# Patient Record
Sex: Female | Born: 1950 | Race: White | Hispanic: No | Marital: Single
Health system: Midwestern US, Academic
[De-identification: ages and names within clinical notes are randomized; demographics above are authoritative.]

---

## 2016-09-01 ENCOUNTER — Encounter: Admit: 2016-09-01 | Discharge: 2016-09-02

## 2016-09-01 DIAGNOSIS — R69 Illness, unspecified: Principal | ICD-10-CM

## 2016-10-03 ENCOUNTER — Encounter: Admit: 2016-10-03 | Discharge: 2016-10-04

## 2016-10-03 DIAGNOSIS — R69 Illness, unspecified: Principal | ICD-10-CM

## 2016-11-03 ENCOUNTER — Encounter: Admit: 2016-11-03 | Discharge: 2016-11-03

## 2016-11-03 DIAGNOSIS — R69 Illness, unspecified: Principal | ICD-10-CM

## 2016-11-11 ENCOUNTER — Encounter: Admit: 2016-11-11 | Discharge: 2016-11-11

## 2016-11-11 ENCOUNTER — Ambulatory Visit: Admit: 2016-11-11 | Discharge: 2016-11-11 | Payer: MEDICARE

## 2016-11-11 DIAGNOSIS — I639 Cerebral infarction, unspecified: Principal | ICD-10-CM

## 2016-11-11 DIAGNOSIS — M76822 Posterior tibial tendinitis, left leg: Principal | ICD-10-CM

## 2016-11-11 NOTE — Progress Notes
Date of Service: 11/11/2016    Subjective:   Left lower extremity pain             History of Present Illness:  Katelyn Christian is a 66 y.o. female.  She is here today for evaluation of her left lower extremity.  She states that she fractured her fifth metatarsal with a twisting injury over three years ago.  It was treated with immobilization.  She has had pain since that time.  She currently has complaints of pain medially.  She states that she has always had flat feet.  She states today that her thigh is twisting since this injury as well.  She has tried a cam walker, but couldn't tolerate it.  She has also tried an Database administrator.  She has seen multiple neurologists, gotten a CT scan and an EMG, as well as an MRI of her spine.  Given the chronicity and degree of her symptoms, she is here today for my evaluation and treatment recommendations.     Past Medical History:   Diagnosis Date   ??? Stroke [facility])        Past Surgical History:   Procedure Laterality Date   ??? ELBOW SURGERY     ??? HX APPENDECTOMY     ??? HX CARPAL TUNNEL RELEASE     ??? HX CHOLECYSTECTOMY         History reviewed. No pertinent family history.    Social History     Occupational History   ??? Not on file.     Social History Main Topics   ??? Smoking status: Current Some Day Smoker     Packs/day: 1.00     Years: 40.00   ??? Smokeless tobacco: Never Used   ??? Alcohol use No   ??? Drug use: No   ??? Sexual activity: Not on file       Allergies   Allergen Reactions   ??? Cortisone UNKNOWN     hypertension   ??? Prednisone UNKNOWN     hypertension         Review of Systems   All other systems reviewed and are negative.        Objective:         ??? ALPRAZolam (XANAX) 1 mg tablet Take 1 mg by mouth four times daily.   ??? aspirin EC 81 mg tablet Take 81 mg by mouth daily. Take with food.   ??? ezetimibe (ZETIA) 10 mg tablet Take 10 mg by mouth daily.   ??? GABAPENTIN PO Take  by mouth.   ??? levothyroxine sodium (LEVOTHYROXINE PO) Take  by mouth. ??? trazodone HCl (TRAZODONE PO) Take  by mouth.   ??? venlafaxine HCl (VENLAFAXINE PO) Take  by mouth.     Vitals:    11/11/16 1149   BP: 125/78   Pulse: 78   Weight: 83.9 kg (185 lb)   Height: 154.9 cm (61)     Body mass index is 34.96 kg/m???.     Physical Exam  Ortho Exam  She has had this remote fracture of her fifth metatarsal.  It doesn't seem to be related to her current symptoms.  The pain is mainly on the medial aspect of her left ankle and hindfoot.  She is definitely tender along the expected course of her posterior tibial tendon.  Her ankle seems stable to stress.  She does, indeed, have some swelling medially.  She seems like she can maybe fire her posterior tibial tendon, but it's certainly  not strong.  Her Achilles area seems like it's okay.  Her pulses are palpable.  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      X Rays:  She has some plain films.  They are not terribly revealing.  You can see the old, healed fracture of her fifth metatarsal.      CT Scan:  She has had a CT scan through her foot and a portion of her ankle.  I don't see anything terribly dramatic on this.      MRI:  She has supposedly had an MRI scan of her back.  Her ankle joint mortise seems to be maintained.  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       Assessment and Plan:  It's my impression that she has left posterior tibial tendonitis/insufficiency.    I discussed the nature of the problem and treatment alternatives with Rene Kocher in detail.  I would like to get an MRI through this to assess her posterior tibial tendon.  I have told her that if the tendon is stretched out or torn, then a brace can be considered.  There are times that a surgical procedure can be entertained.  She is taking oral Neurontin, but is unsure why she is taking it.  I have told her that if she doesn't feel that it is beneficial, then she should ask the physician.  She will get an MRI, and will go back into the boot in the meantime for comfort.                      In the presence of Ree Shay, MD,  I have taken down these notes, Danielle Rankin, [employer]. 11/11/2016 1:36 PM

## 2016-11-19 ENCOUNTER — Ambulatory Visit: Admit: 2016-11-19 | Discharge: 2016-11-19 | Payer: MEDICARE

## 2016-11-19 DIAGNOSIS — M76822 Posterior tibial tendinitis, left leg: Principal | ICD-10-CM

## 2016-11-27 ENCOUNTER — Encounter: Admit: 2016-11-27 | Discharge: 2016-11-27

## 2016-11-27 NOTE — Telephone Encounter
Katelyn Christian notified of MRI results. Per Dr. Wilkie Aye, MRI revealed posterior tibial tendinitis, treatment is immobilization by cast or cam walker.  Katelyn Christian verbalized understanding and reports she has a boot at home and will wear it.

## 2020-07-11 IMAGING — MG MAMMOGRAM 3D SCREEN, BILATERAL
9 of 16 series · 9 of 16 positions shown · non-contrast
Comparison: none

[R CC (1 of 2)]
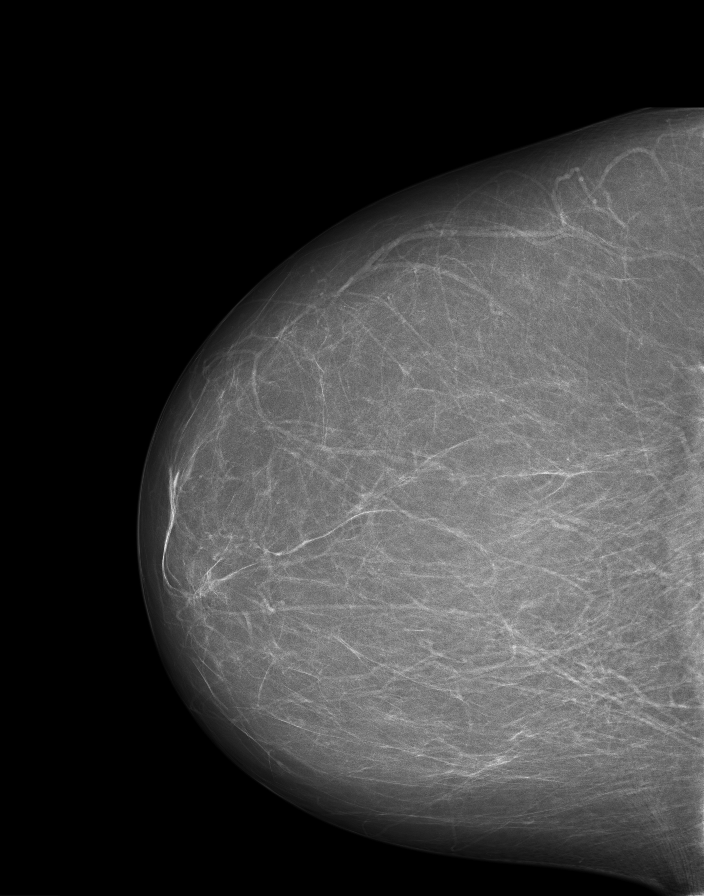

[R tomo]
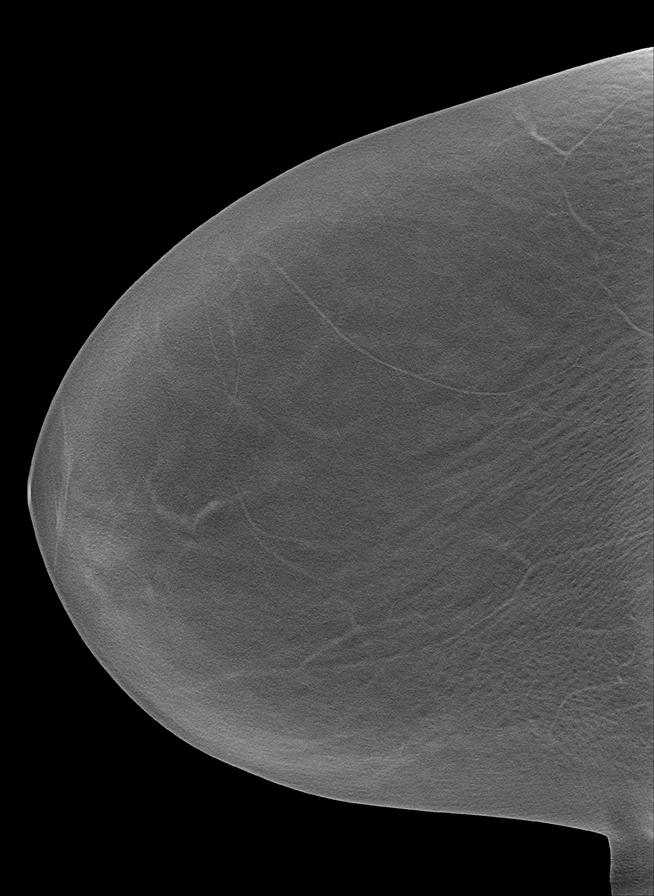

[R CC (2 of 2)]
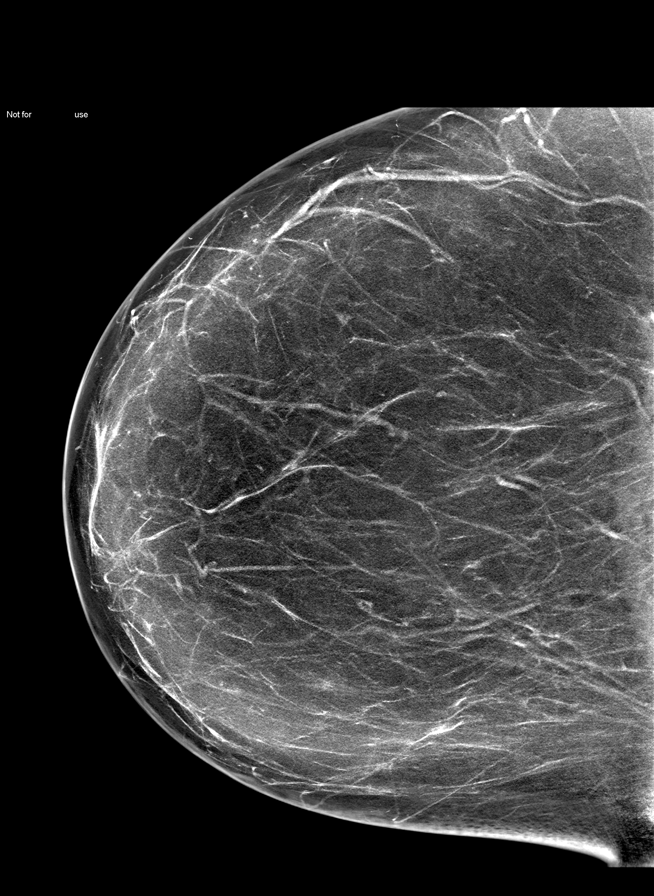

[R]
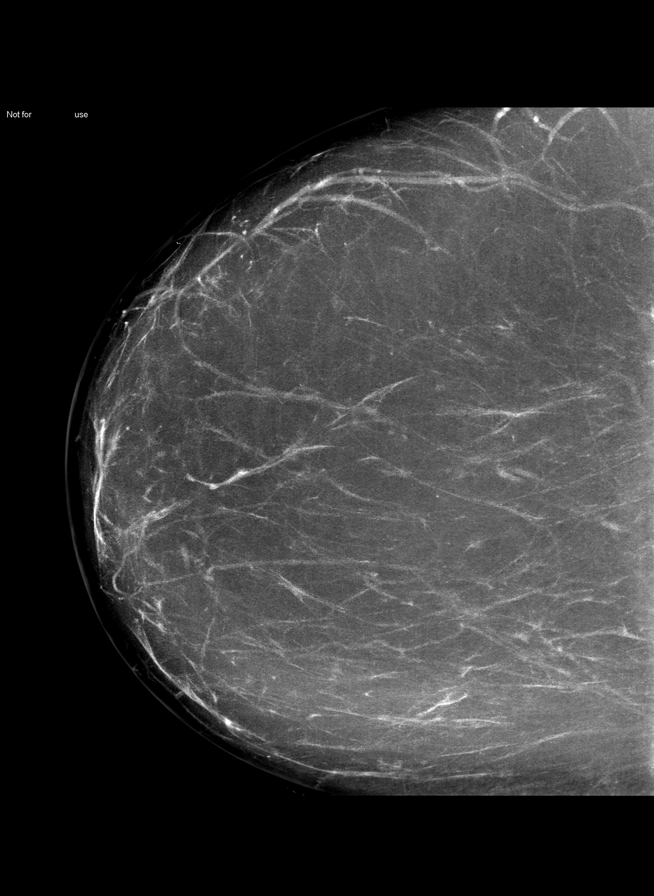

[L CC (1 of 2)]
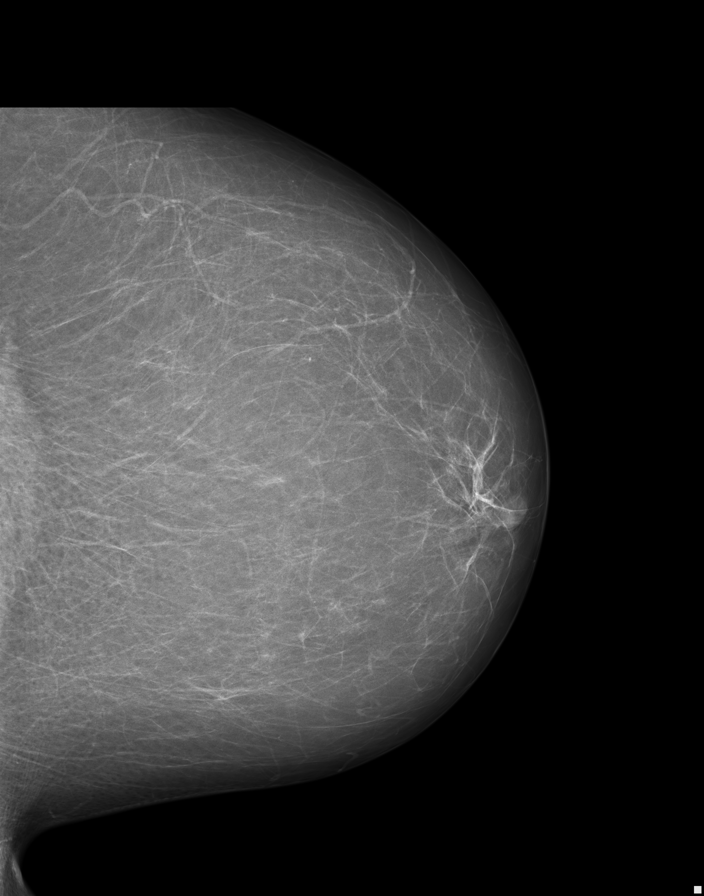

[L tomo]
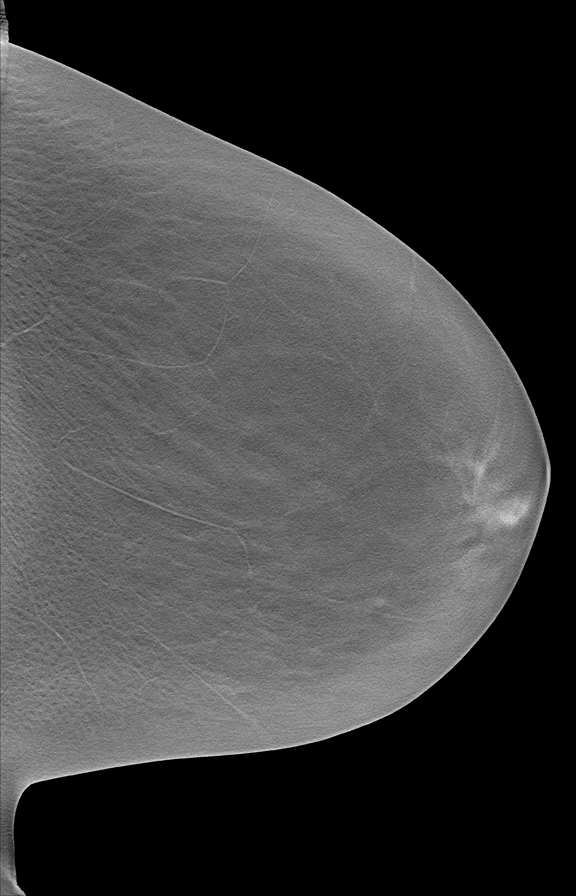

[L CC (2 of 2)]
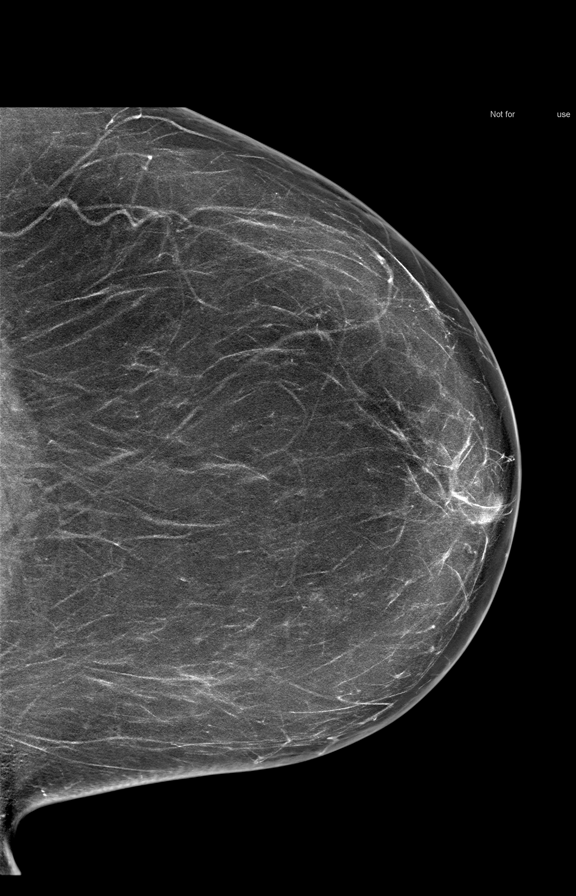

[L]
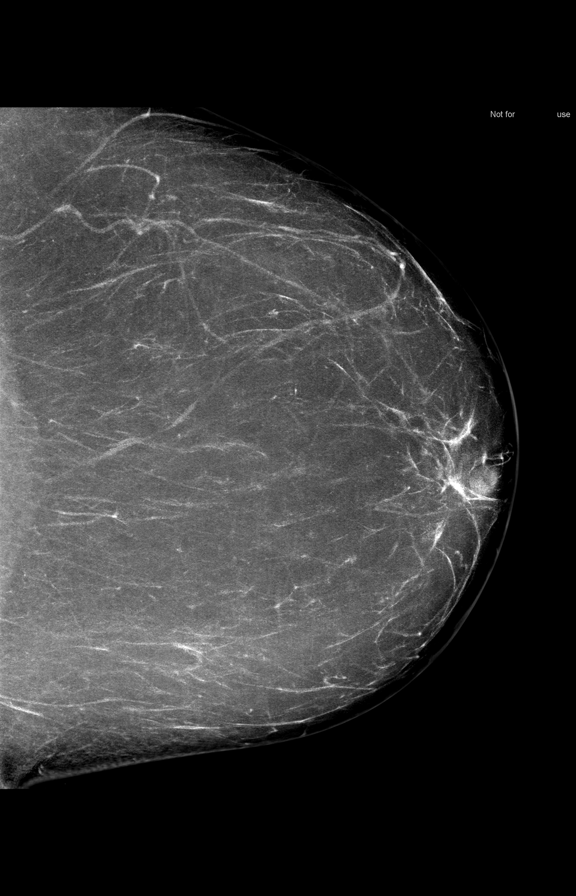

[R MLO]
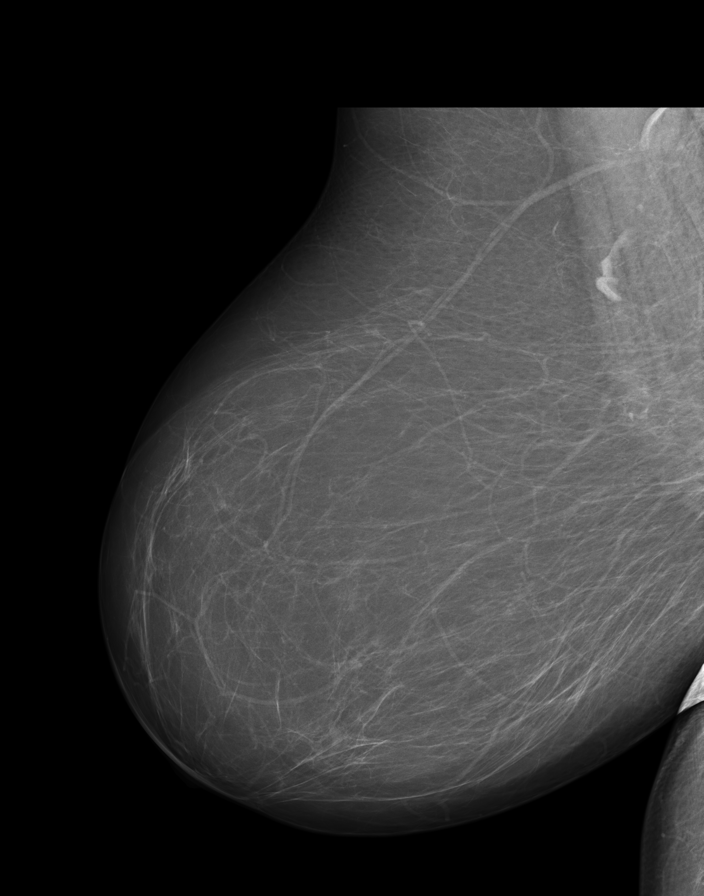

[9 of 16 positions shown; findings below may reference images not displayed]

EXAM
3D SCREENING MAMMOGRAM, BILATERAL

INDICATION
screening
HX. BENIGN FINE NEEDLE BX BILATERALLY AND SURGICAL BX RT BREAST- PT DOENS'T KNOW WHEN. SCREENING.
AB (3D) PRIORS: [id number].

TECHNIQUE
Digital 2D CC and MLO projections obtained with 3D tomographic views per manufacturer's protocol.
ICAD version 7.2 was used during this exam.

COMPARISONS
September 20, 2010

FINDINGS
ACR Type 1: <25% Breast is almost entirely fat.
No concerning masses, calcifications, or interval changes are seen.

IMPRESSION
Stable bilateral mammography. One year follow-up is recommended.
BI-RADS 2, BENIGN.

Tech Notes:

## 2020-08-17 IMAGING — CT ABDOMEN_PELVIS W(Adult)
2 of 3 series · 12 of 46 positions shown, 14 images · IV contrast (Omnipaque)
Comparison: none

PROCEDURE: ABDOMEN_PELVIS W(Adult)
HISTORY: rectal bleeding x 1 year
hx appy, chole, colon resection
50 mL [id number]
creat:
TECHNIQUE: Axial CT imaging of the abdomen and pelvis was performed with IV contrast. This exam was
performed using one or more the following dose reduction techniques: Automated exposure control,
adjustment of the mA and/or KV according to the patient's size or use of iterative reconstruction
technique.

[Series 2: abdomen ax 3.00 br40 s3 · axial · 0.67mm/px · z∈[+1293,+1731]mm · 9 of 167 slices shown, 11 images]
[im 11/167  soft-tissue]
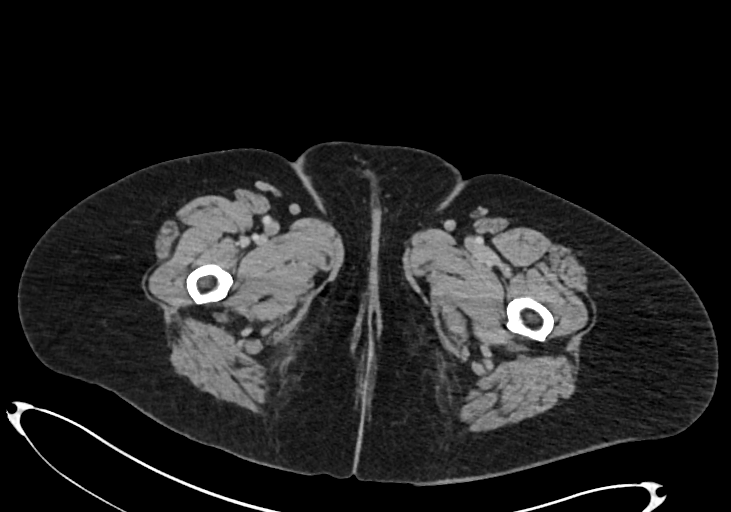
[im 11/167  bone]
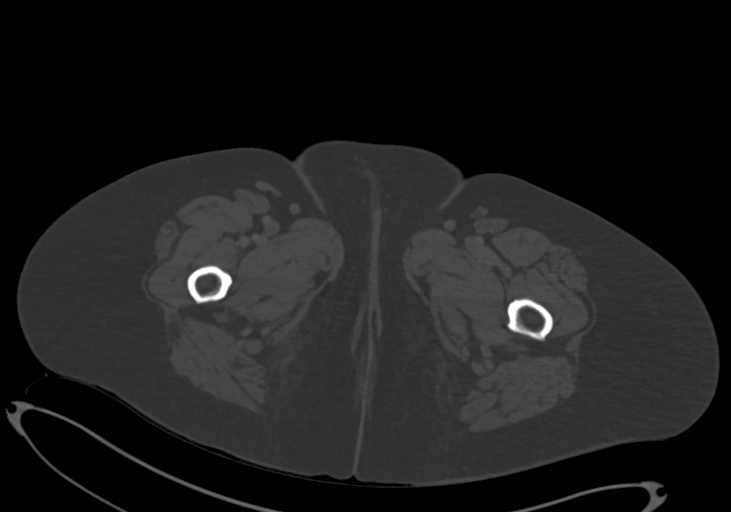
[im 33/167  soft-tissue]
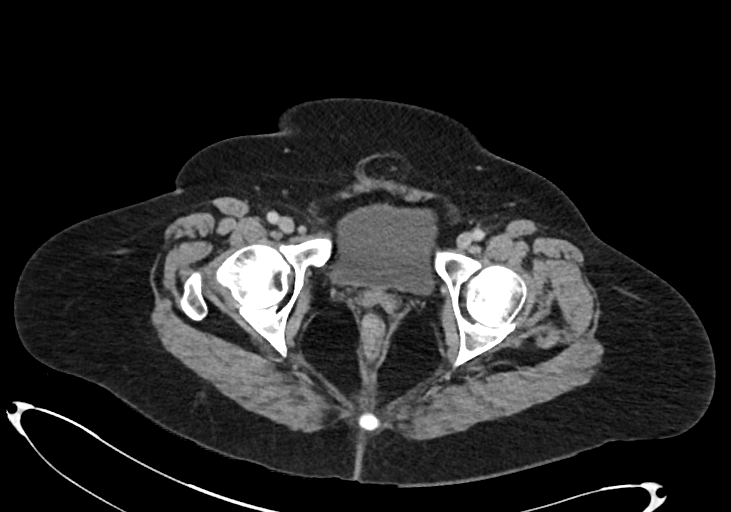
[im 49/167  soft-tissue]
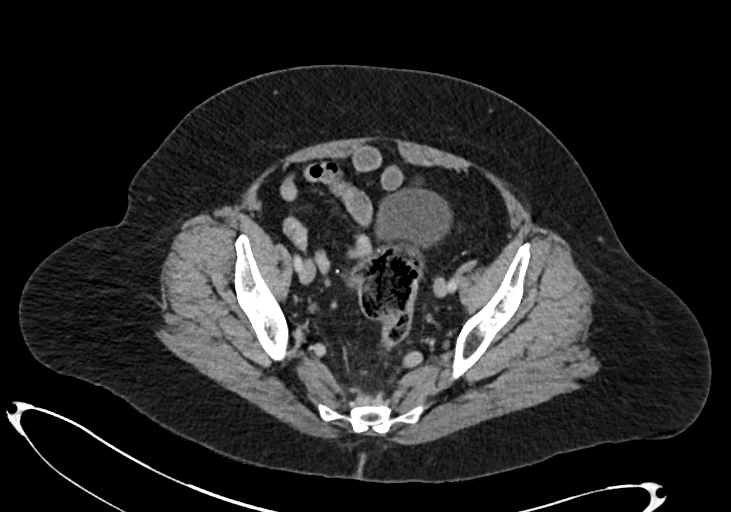
[im 65/167  soft-tissue]
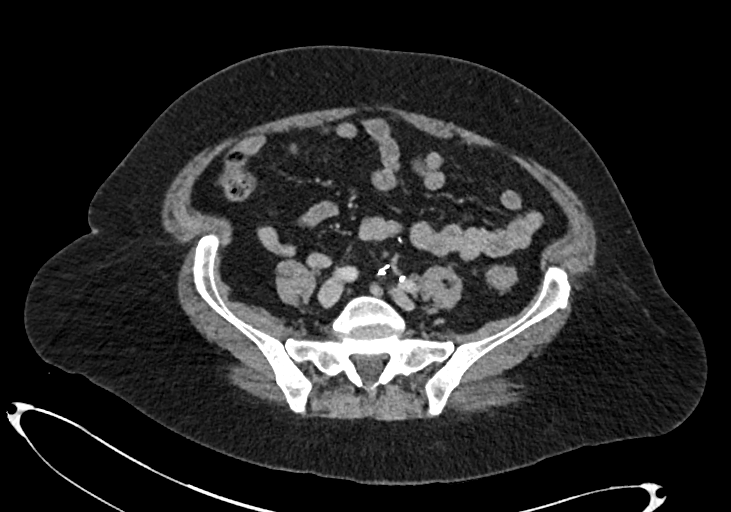
[im 86/167  soft-tissue]
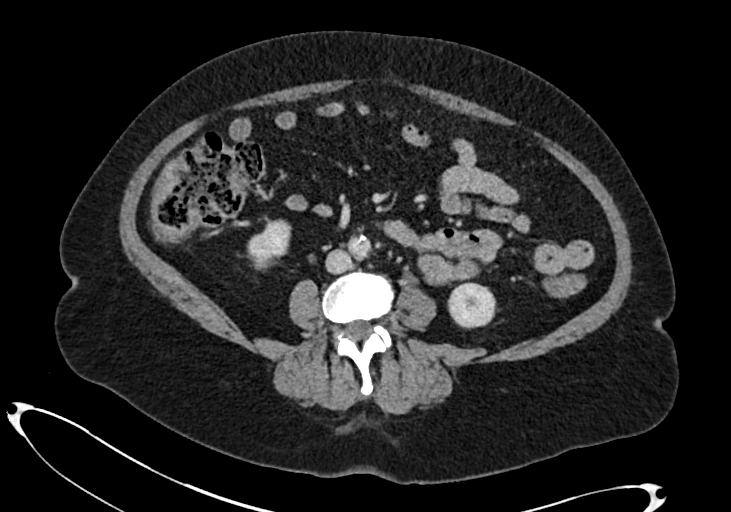
[im 102/167  soft-tissue]
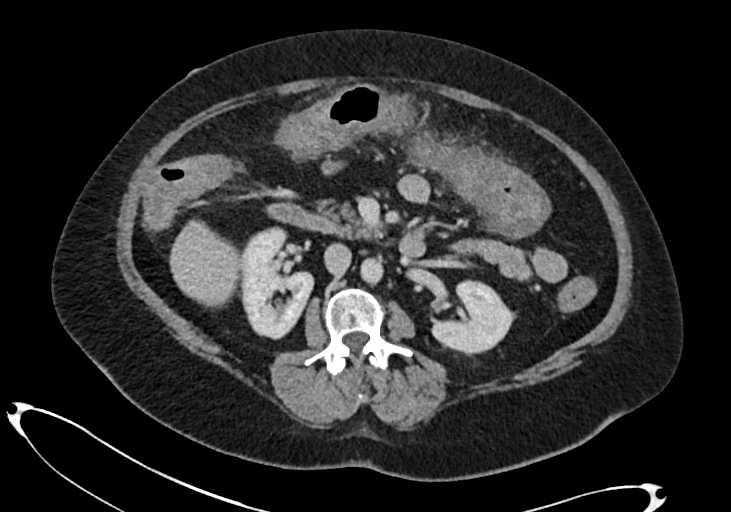
[im 118/167  soft-tissue]
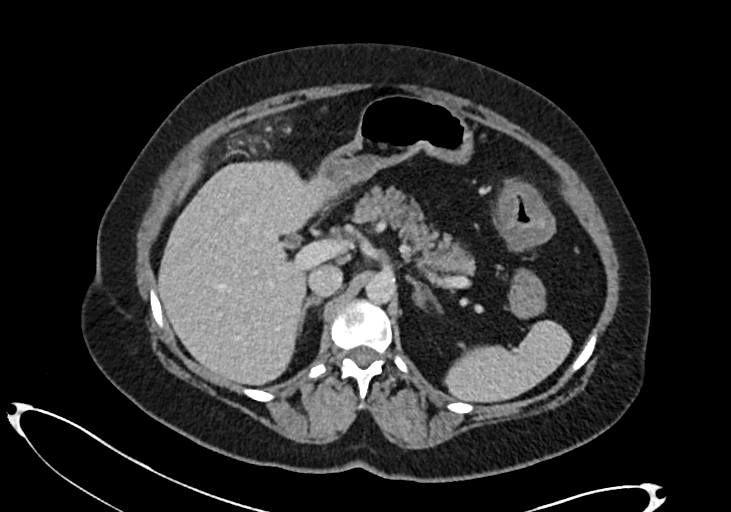
[im 140/167  soft-tissue]
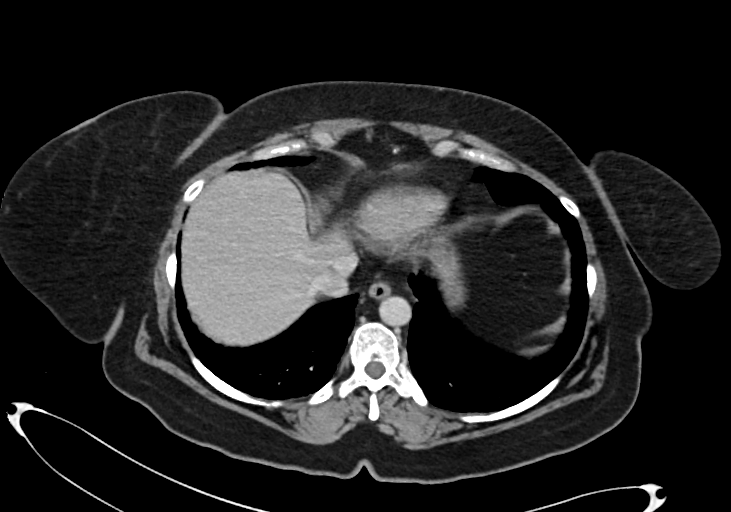
[im 156/167  soft-tissue]
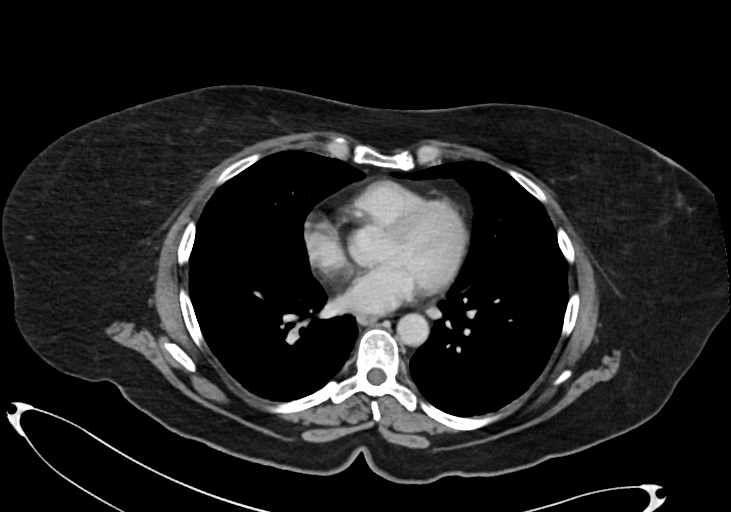
[im 156/167  bone]
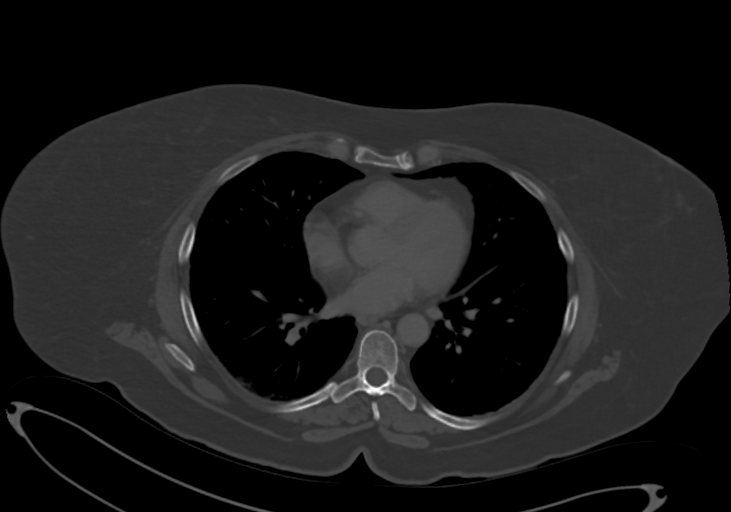

[Series 4: abdomen cor 3.00 br40 s3 · coronal · 0.96mm/px · 3 of 114 slices shown]
[im 38/114  soft-tissue]
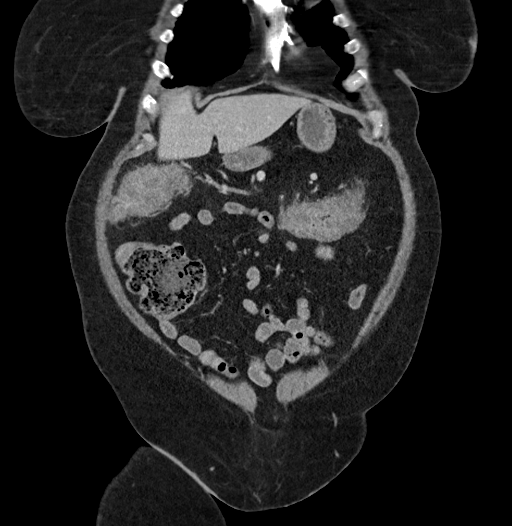
[im 51/114  soft-tissue]
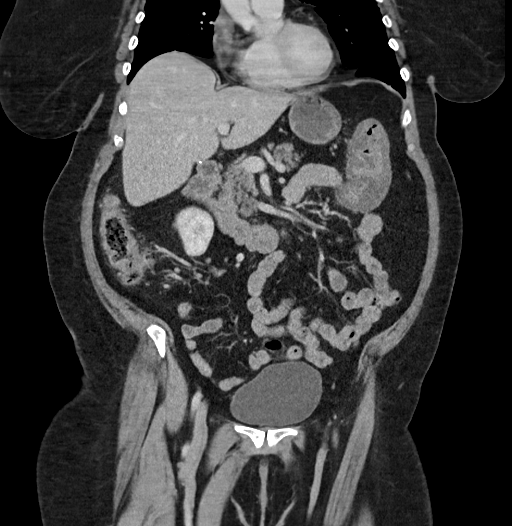
[im 63/114  soft-tissue]
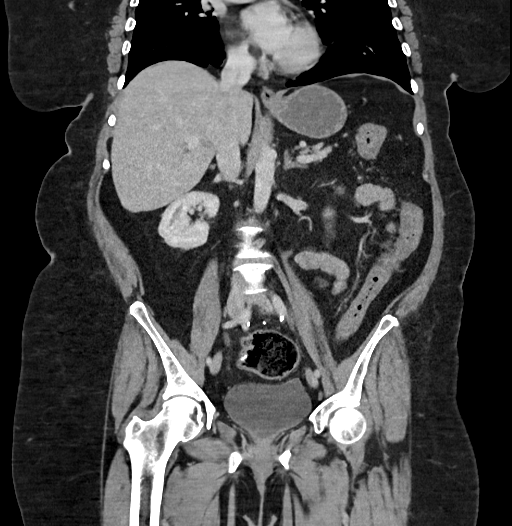

[12 of 46 positions shown; findings below may reference images not displayed]

FINDINGS: The lung bases are clear. Surgical changes are seen reflecting cholecystectomy. The liver, spleen,
gallbladder, pancreas, and adrenals are within normal limits. There is no intrahepatic ductal
dilatation seen.
No mesenteric or retroperitoneal adenopathy is seen. The right kidney shows no hydronephrosis or
perinephric fat stranding. The left kidney shows no hydronephrosis or perinephric fat stranding. The
aorta is
normal in course and caliber. No pathologically dilated loops of large or small bowel are seen. The
appendix
is not definitively seen in the right lower quadrant. Moderate to severe diffuse wall thickening is
seen of the
ascending, transverse, and proximal two thirds of the descending colon. No perforation or abscess is
seen.
Surgical changes are seen reflecting partial colectomy with anastomosis. There is no free air or
free fluid
seen in the abdomen. The contrasted urinary bladder is unremarkable. Diastases recti seen in the
lower
abdomen with small ventral abdominal defect containing hernia.
IMPRESSION: 1. There is no hydronephrosis or perinephric fat stranding seen.
2. Nonobstructive bowel gas pattern without free air.
3. Moderate to severe colitis is seen extensively involving the ascending, transverse, and proximal
two
thirds of the descending colon. No perforation or abscess is seen.

Tech Notes:

Rectal bleeding x1 year
hx appy, chole, colon resection

## 2020-10-16 IMAGING — CR CHEST
4 series · 4 of 4 positions shown · non-contrast
Comparison: none

[shoulder external]
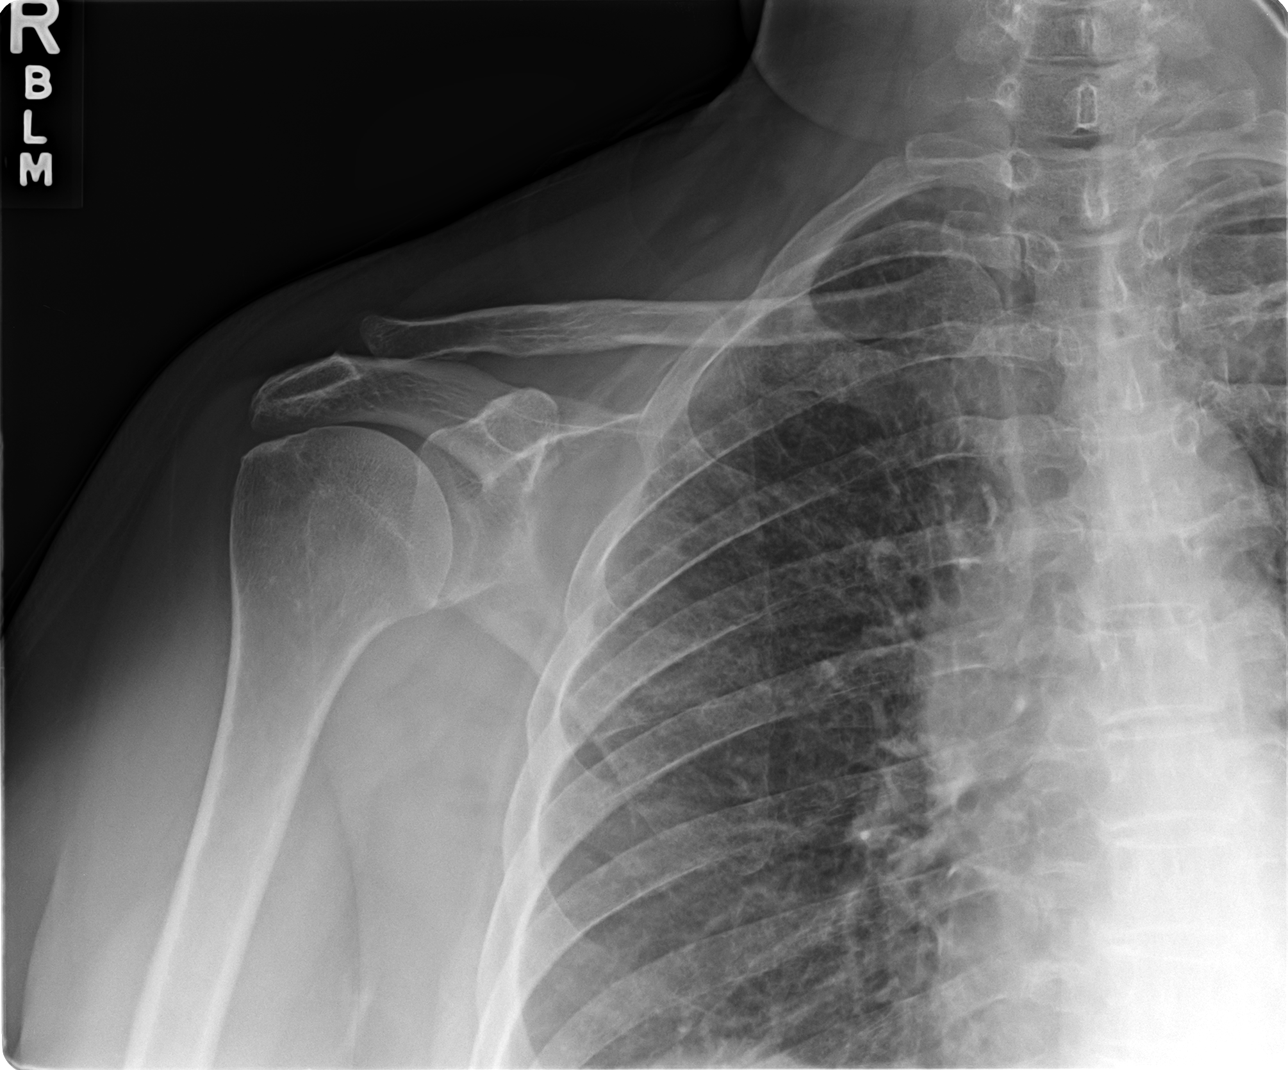

[shoulder internal]
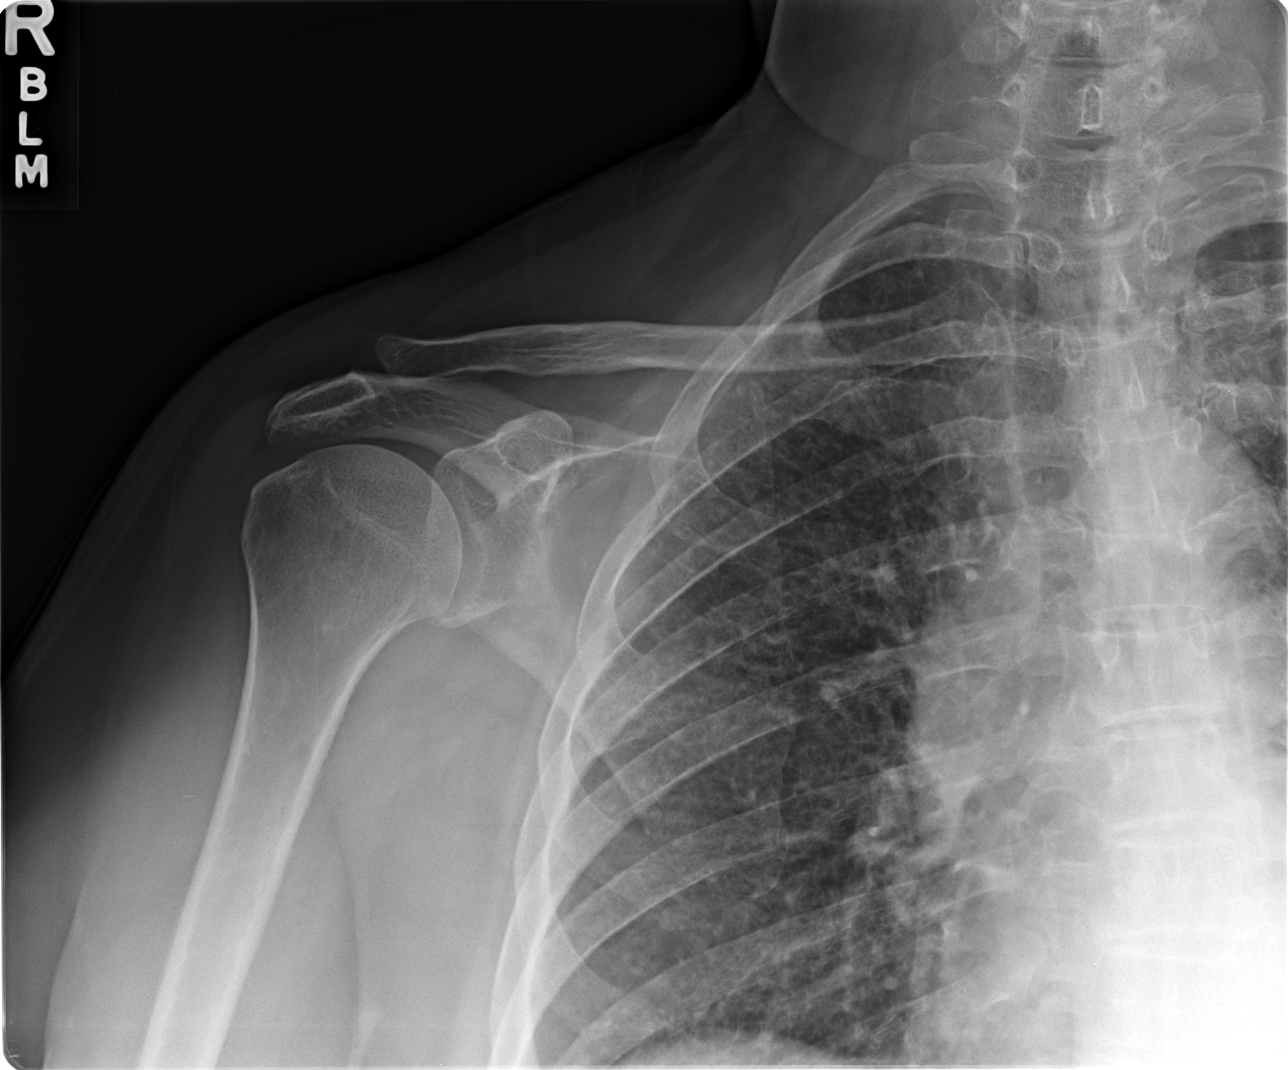

[shoulder y-view]
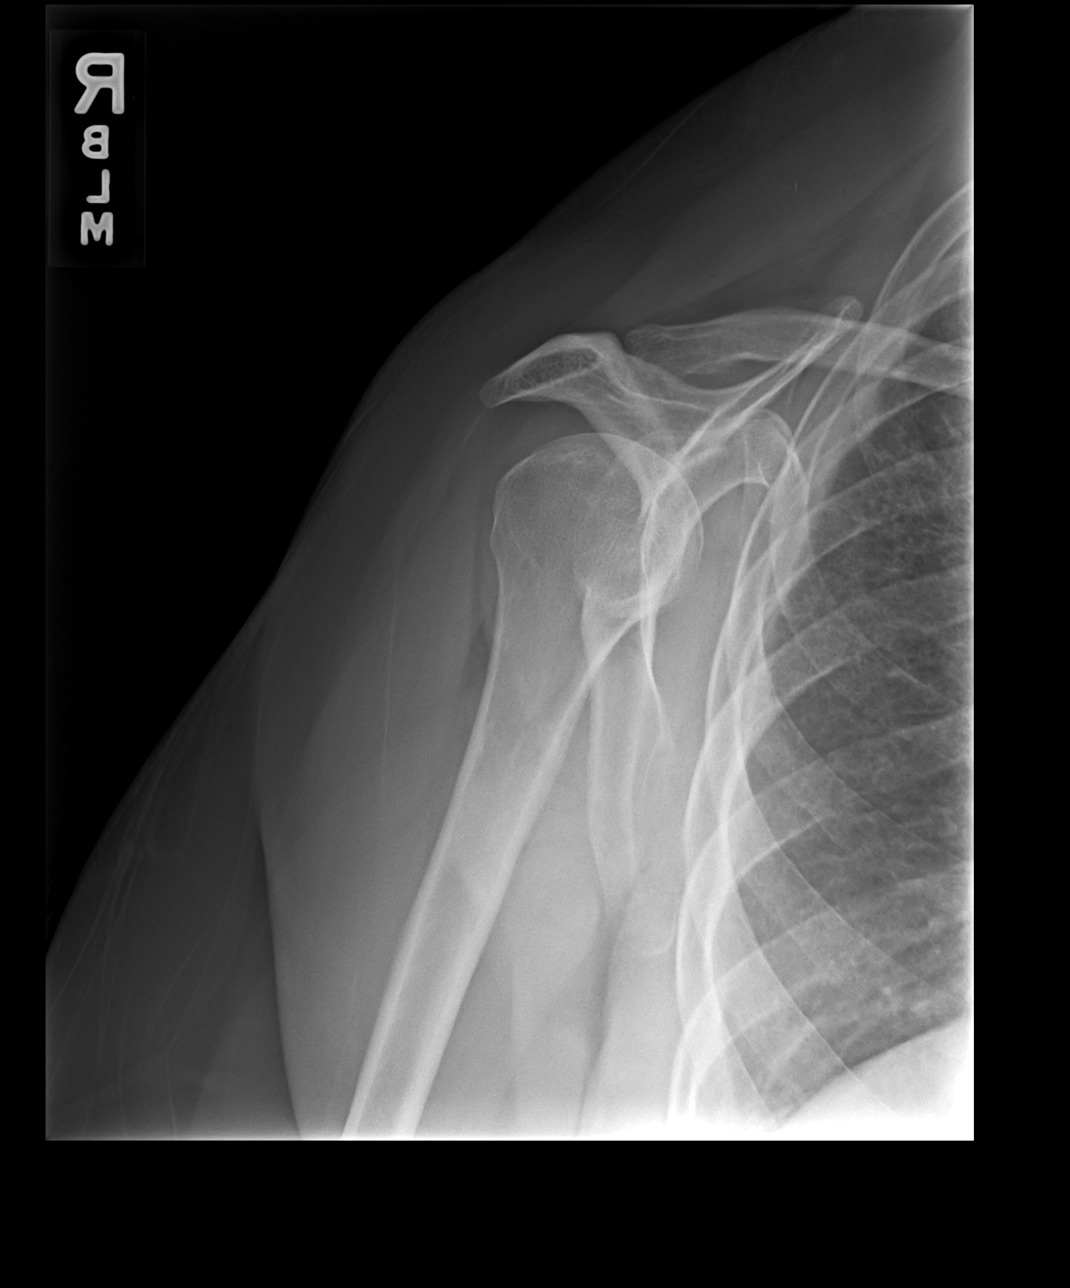

[shoulder axillary]
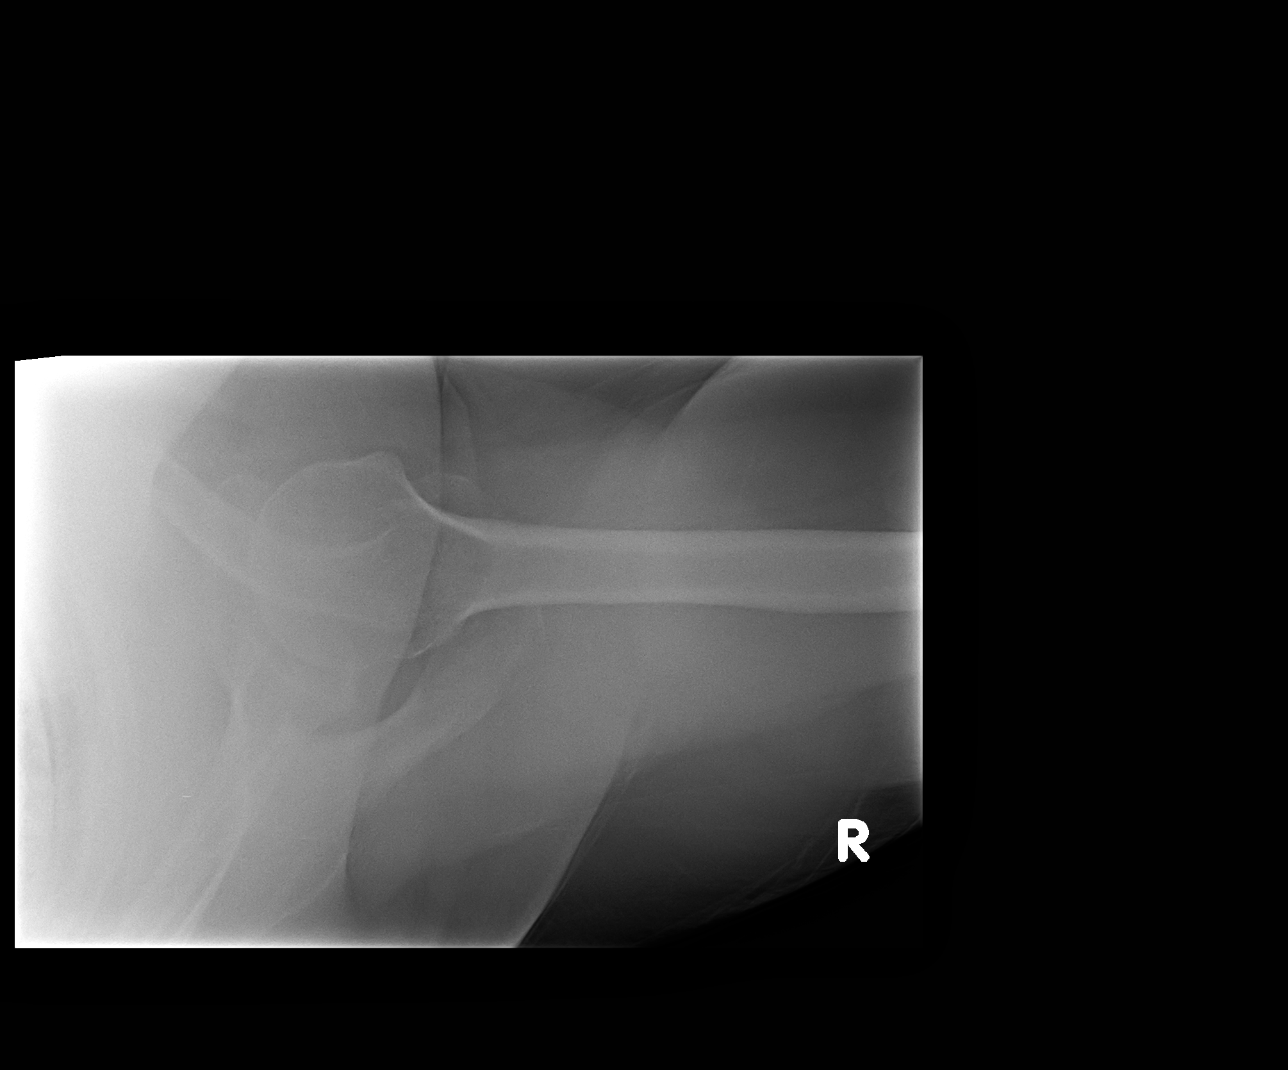

[4 of 4 positions shown; findings below may reference images not displayed]

EXAM

XR shoulder right, complete

INDICATION

right shoulder pain, without injury
remember injuring it. no surgical history of the shoulder. BM/AK

TECHNIQUE

Four views

COMPARISONS

None available

FINDINGS

No fracture or dislocation. Normal osseous mineralization. Soft tissues are unremarkable.

IMPRESSION

No acute osseous findings.

Tech Notes:

remember injuring it. no surgical history of the shoulder. BM/AK

## 2021-04-16 IMAGING — CR LOW_EXM
2 series · 2 of 2 positions shown · non-contrast
Comparison: none

[ankle ap]
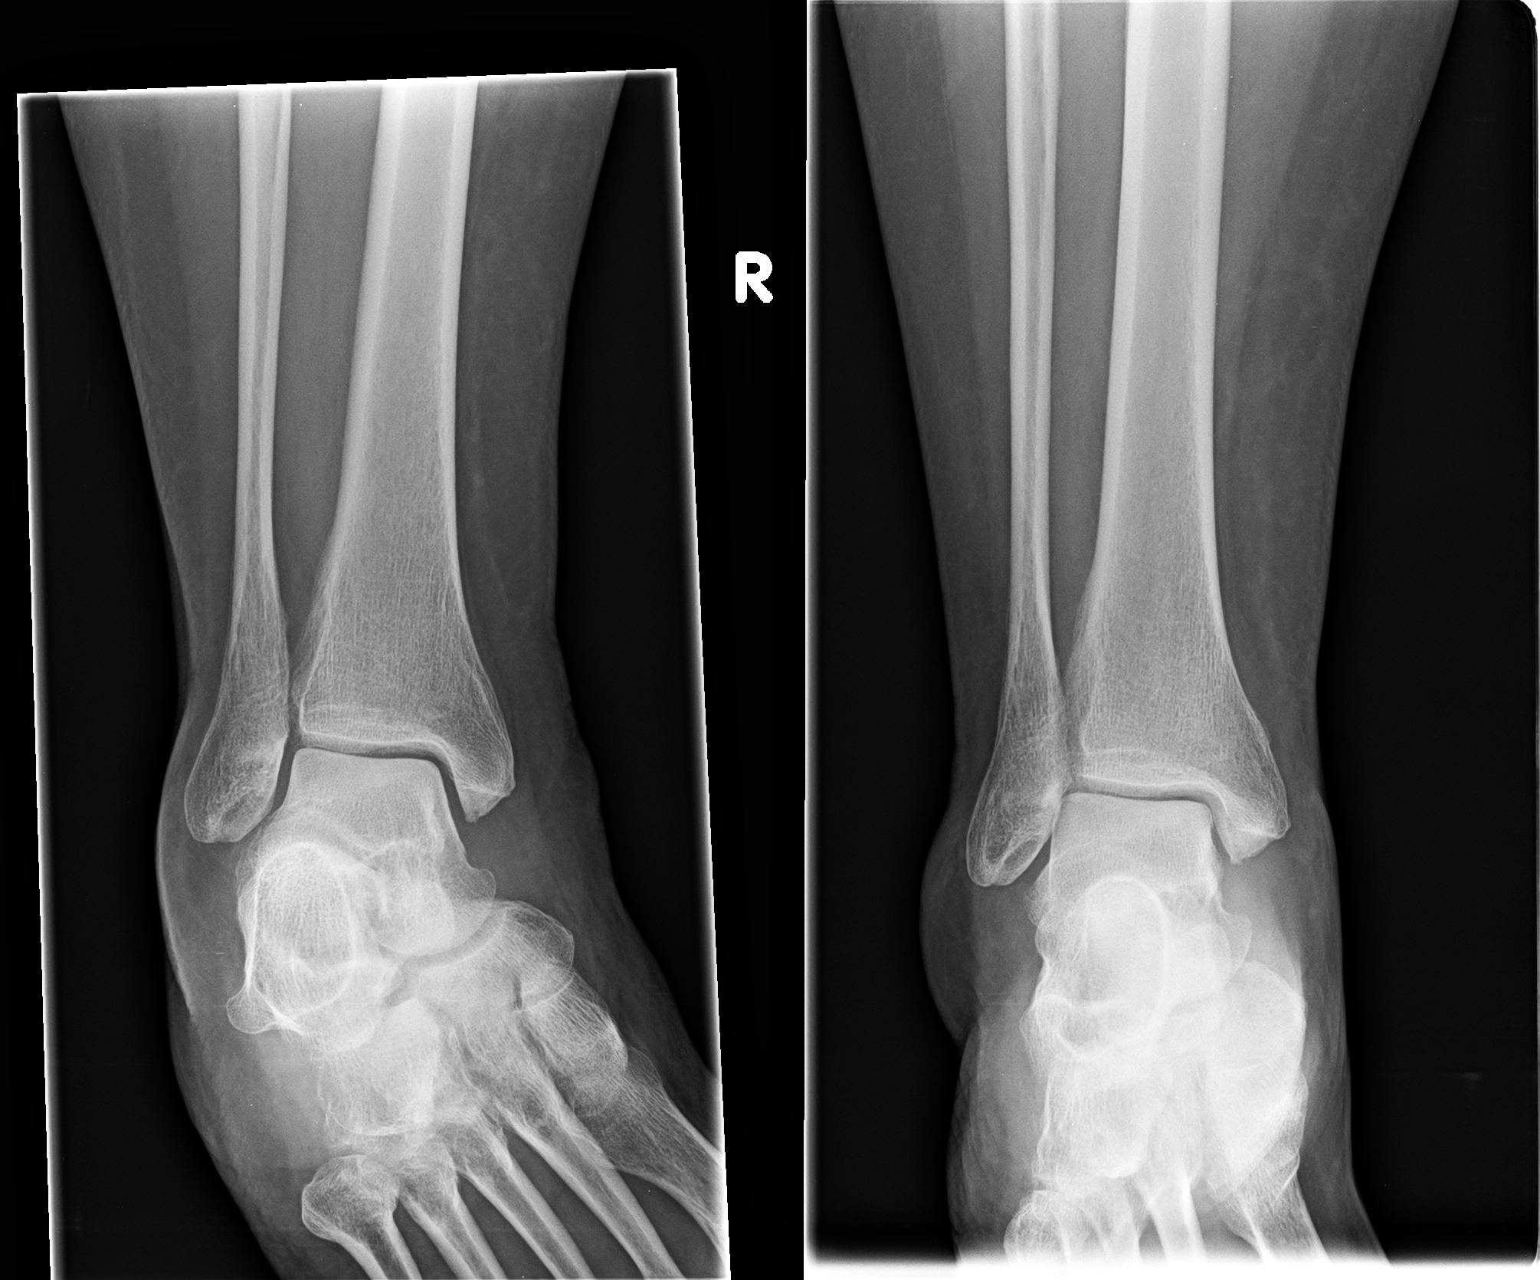

[ankle lat]
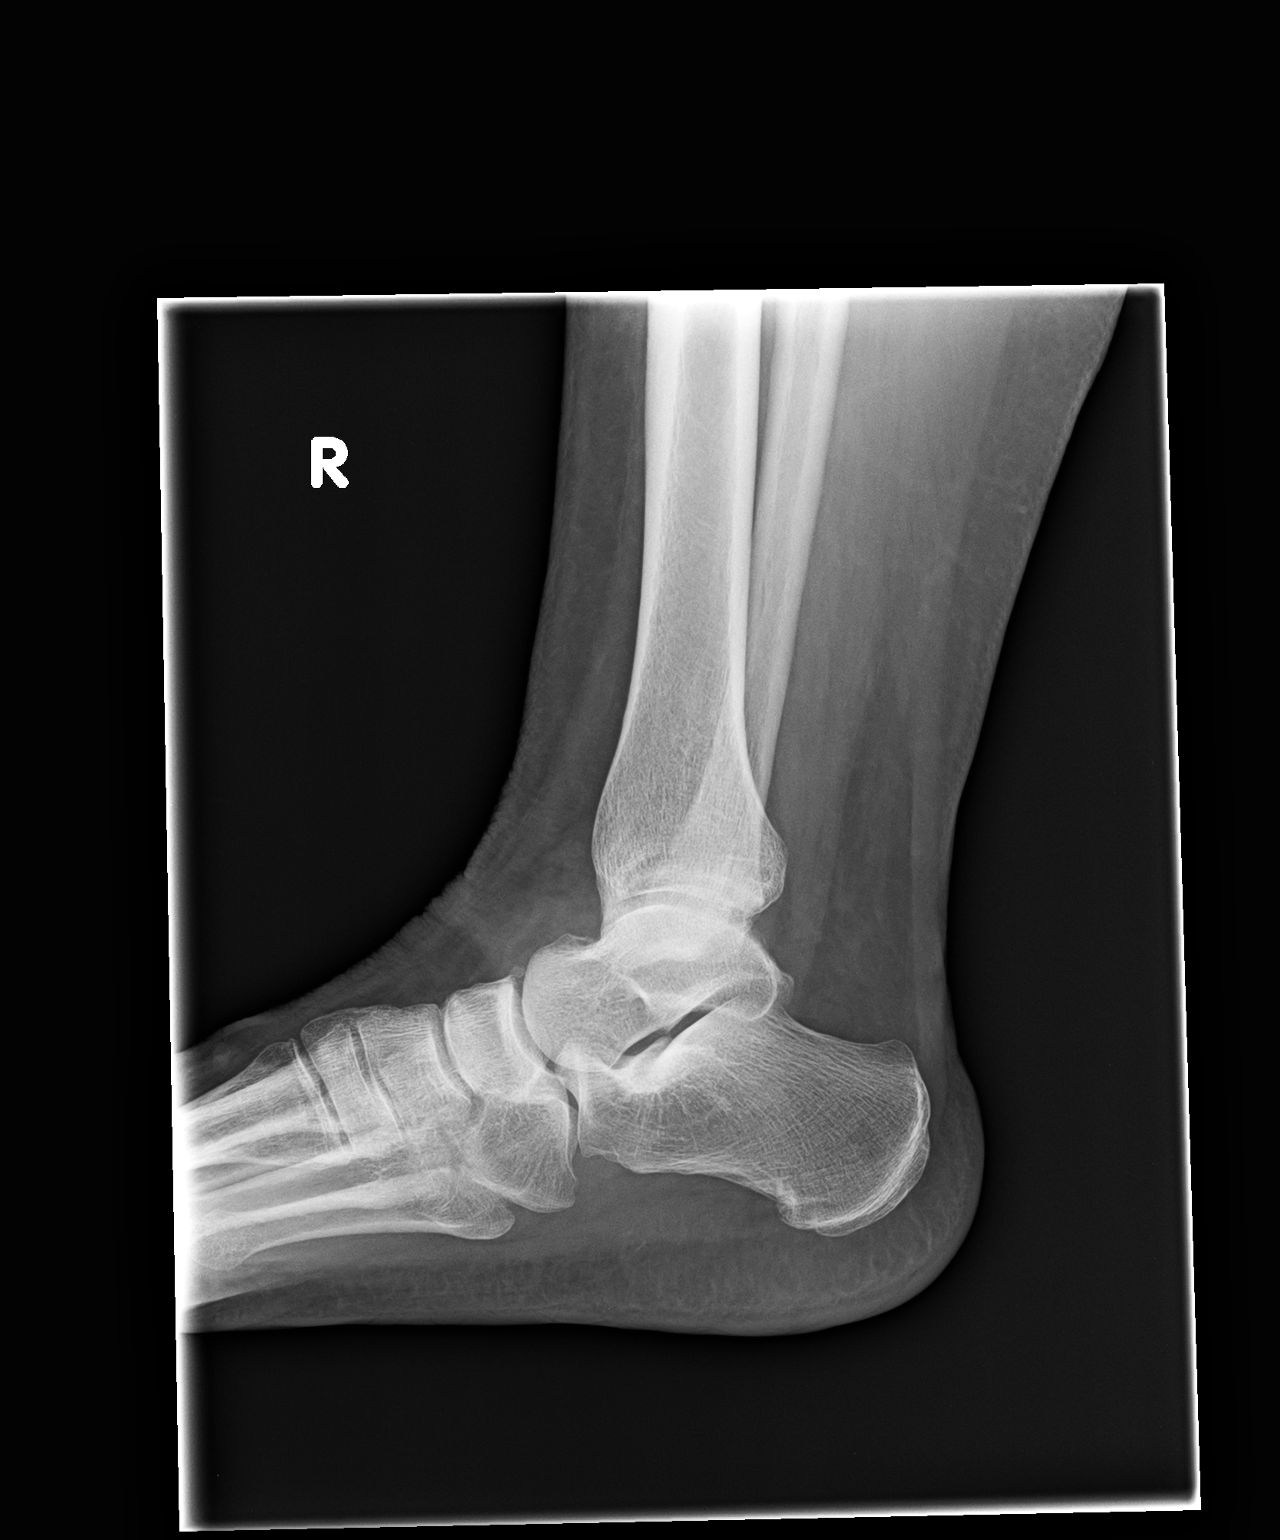

[2 of 2 positions shown; findings below may reference images not displayed]

DIAGNOSTIC STUDIES

EXAM

XR ankle RT min 3V

INDICATION

s/p fall, right ankle pain
PT STATES SHE FELL. C/O PAIN TO THE RT ANKLE. AK/BM

TECHNIQUE

AP lateral and oblique views of the right ankle.

COMPARISONS

None available

FINDINGS

Possible tiny lucency through the lateral talus is seen. The remaining exam is unremarkable.

IMPRESSION

Possible nondisplaced fracture through the lateral talus. Depending on symptoms, short-term follow-
up exam may be beneficial.

Tech Notes:

PT STATES SHE FELL. C/O PAIN TO THE RT ANKLE. AK/BM

## 2021-04-16 IMAGING — CT BRAIN WO(Adult)
3 of 4 series · 15 of 47 positions shown, 18 images · non-contrast
Comparison: none

[Series 4: brain cor 5.00 hr40 s3 · coronal · 0.31mm/px · 3 of 37 slices shown]
[im 13/37  brain]
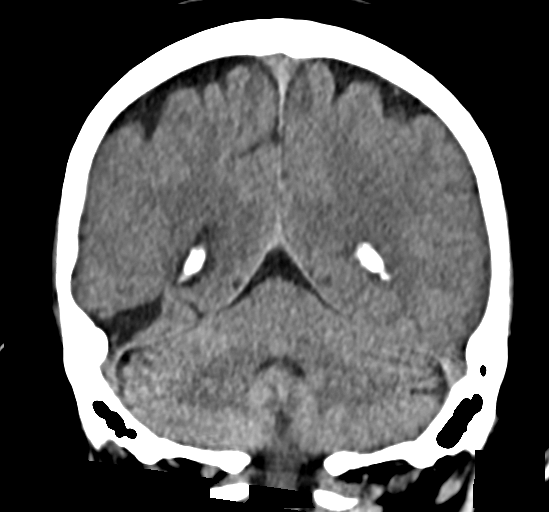
[im 17/37  brain]
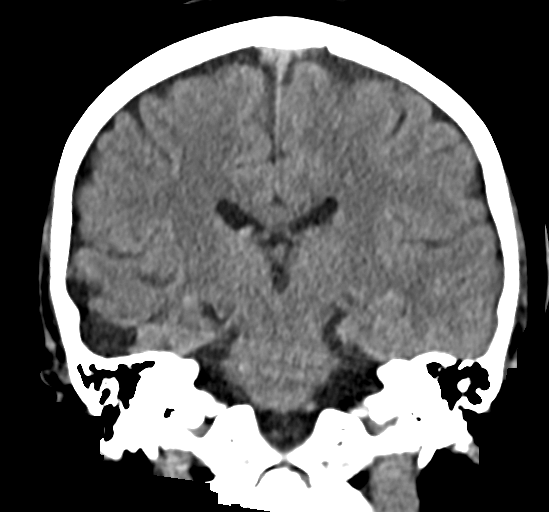
[im 21/37  brain]
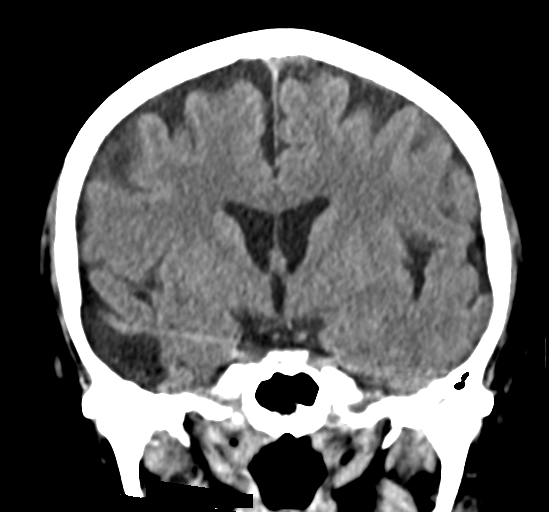

[Series 6: brain sag 5.00 hr40 s3 · sagittal · 0.31mm/px · 3 of 33 slices shown]
[im 12/33  brain]
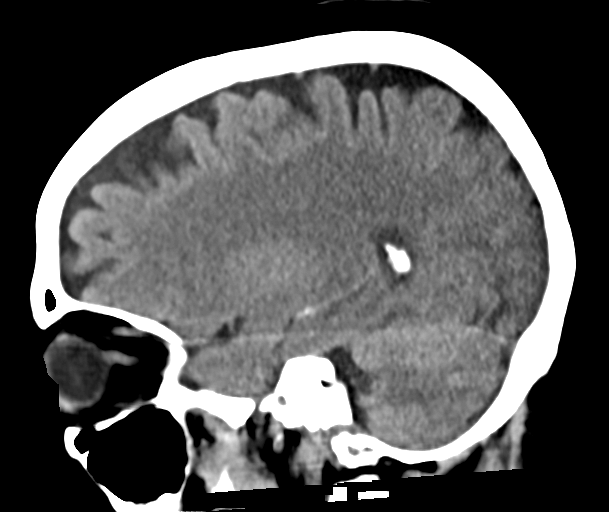
[im 17/33  brain]
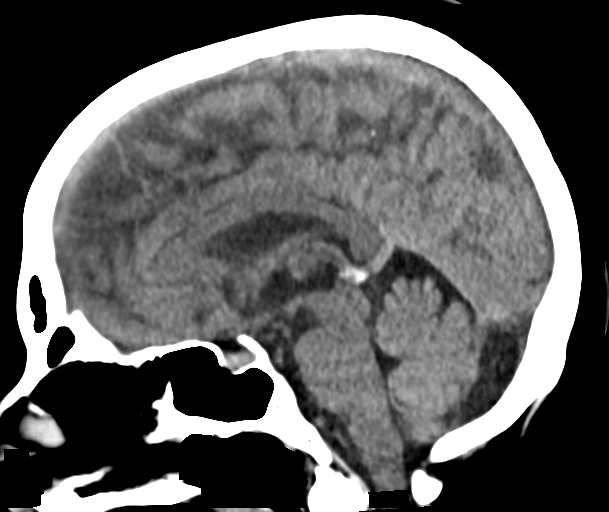
[im 21/33  brain]
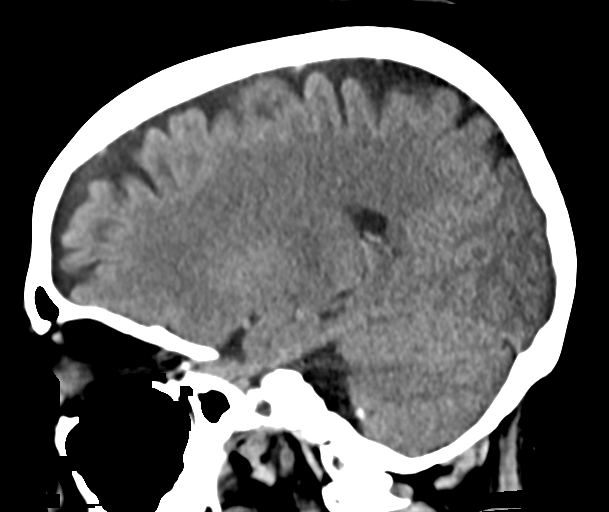

[Series 8: brain ax 2.00 hr60 s3 · axial · 0.34mm/px · z∈[-615,-493]mm · 9 of 78 slices shown, 12 images]
[im 8/78  brain]
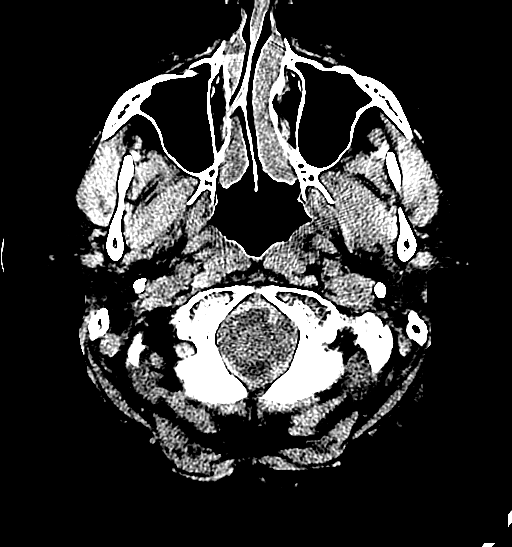
[im 8/78  bone]
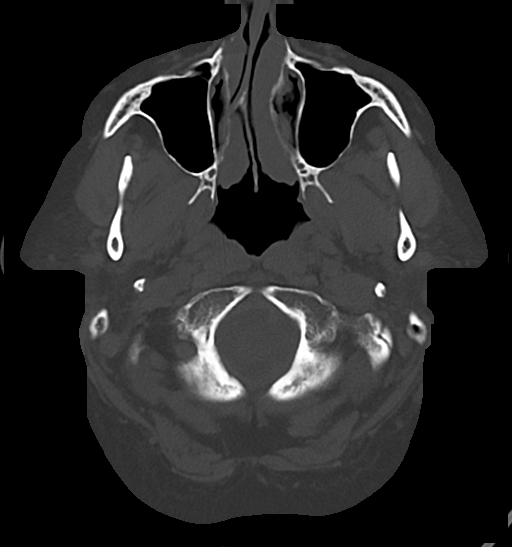
[im 16/78  brain]
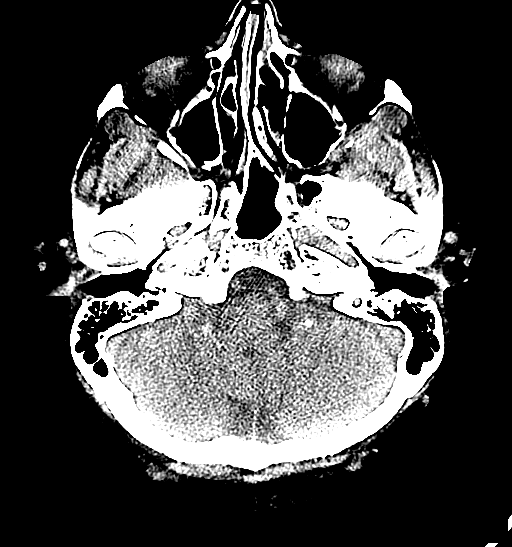
[im 24/78  brain]
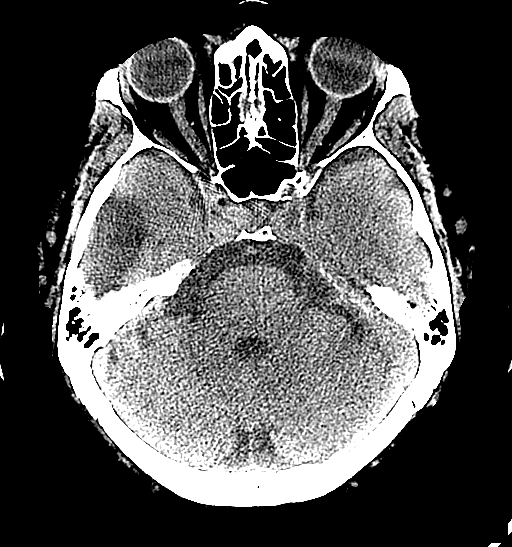
[im 31/78  brain]
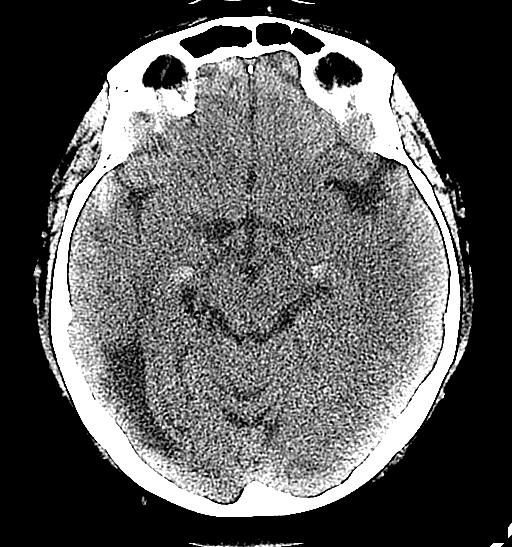
[im 39/78  brain]
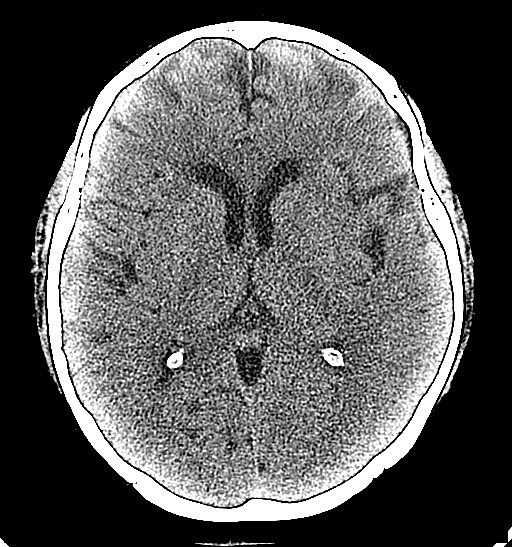
[im 39/78  bone]
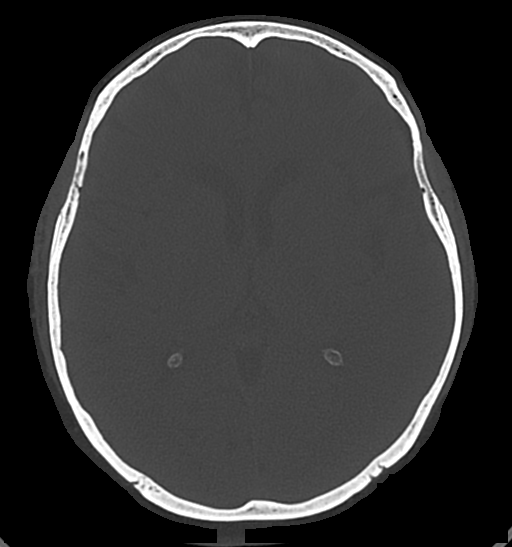
[im 47/78  brain]
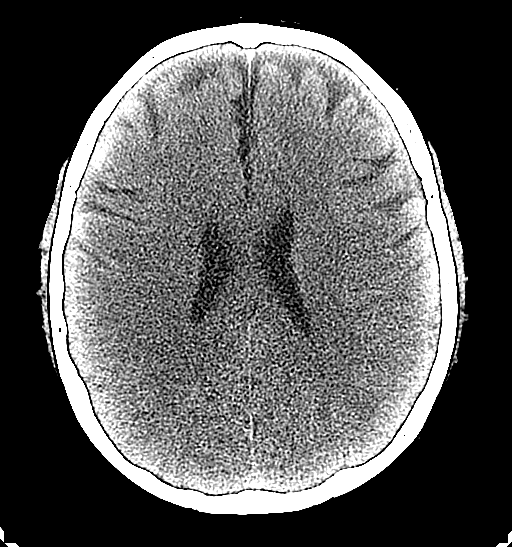
[im 54/78  brain]
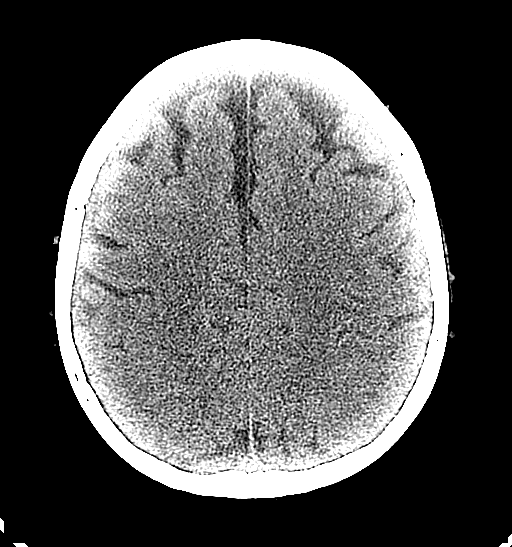
[im 62/78  brain]
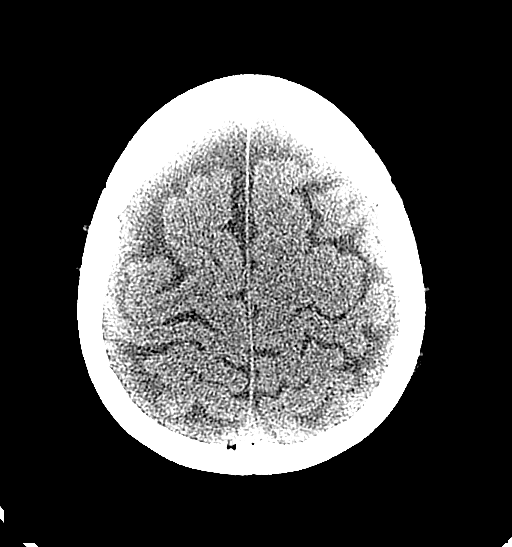
[im 70/78  brain]
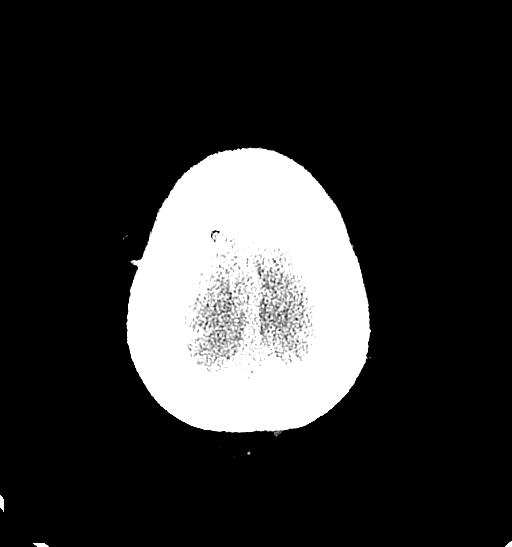
[im 70/78  bone]
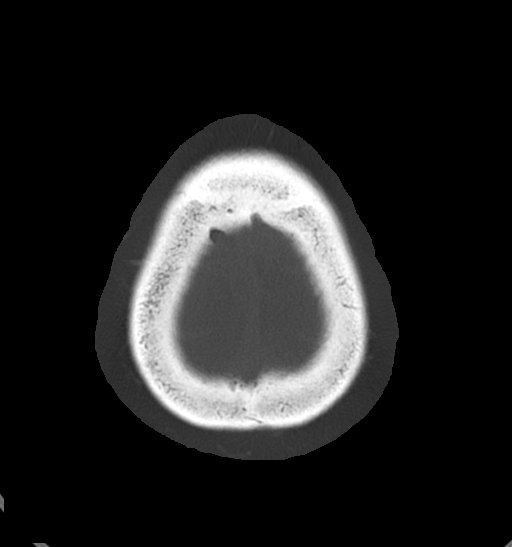

[15 of 47 positions shown; findings below may reference images not displayed]

DIAGNOSTIC STUDIES

EXAM

CT scan of the head without contrast.

INDICATION

s/p fall, unsure LOC
PT STATES HAD A FALL LAST PM. PAIN. HX OF STROKE. CT/NM 1/0. TJ/HB

TECHNIQUE

All CT scans at this facility use dose modulation, iterative reconstruction, and/or weight based
dosing when appropriate to reduce radiation dose to as low as reasonably achievable.

Number of previous computed tomography exams in the last 12 months is 1.

Number of previous nuclear medicine myocardial perfusion studies in the last 12 months is 0  .

COMPARISONS

None available

FINDINGS

Mild cortical atrophy is seen. No intracranial mass or hemorrhage is evident. The ventricular
system is unremarkable. Prominent CSF density can be seen involving the right temporal lobe
consistent with arachnoid cyst. Bony calvarium is normal.

IMPRESSION

No intracranial hemorrhage or mass effect. CSF density in the right temporal lobe likely reflects
large arachnoid cyst.

Tech Notes:

PT STATES HAD A FALL LAST PM. PAIN. HX OF STROKE. CT/NM 1/0. TJ/HB

## 2021-04-16 IMAGING — CR LOW_EXM
2 series · 2 of 2 positions shown · non-contrast
Comparison: none

[knee obl]
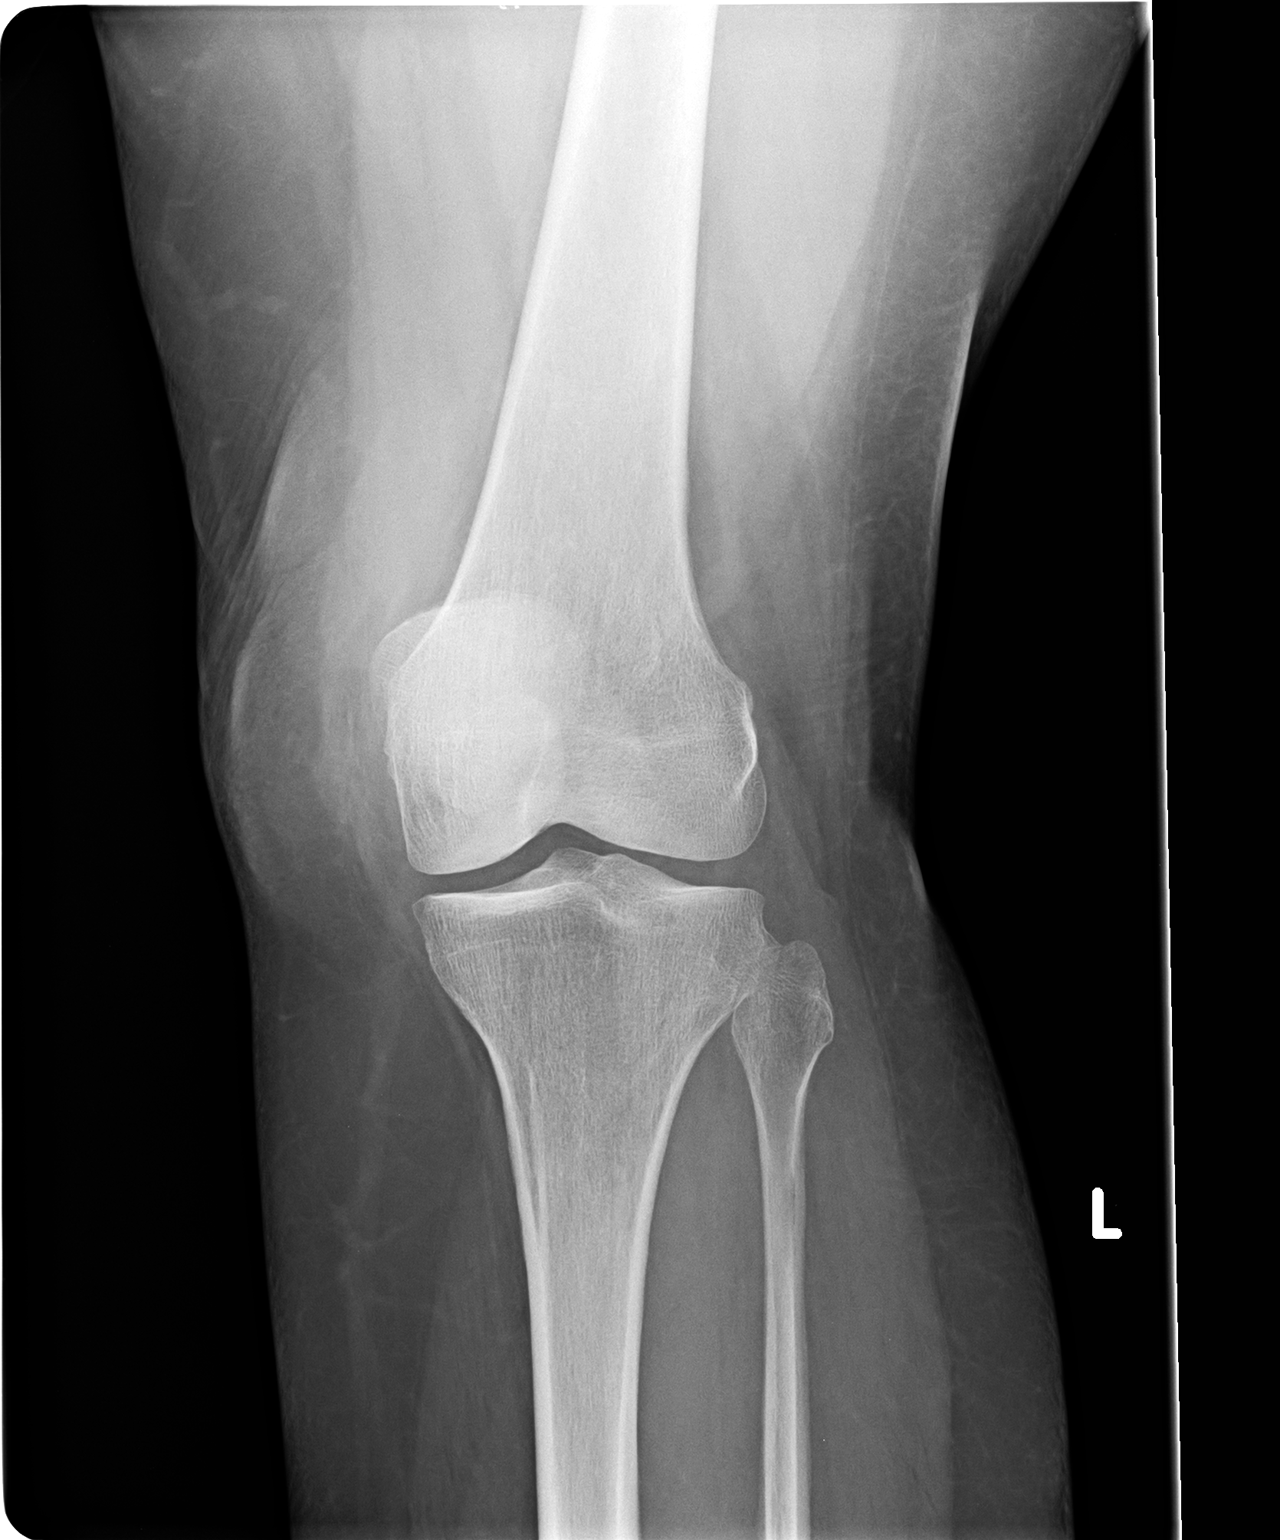

[knee lat]
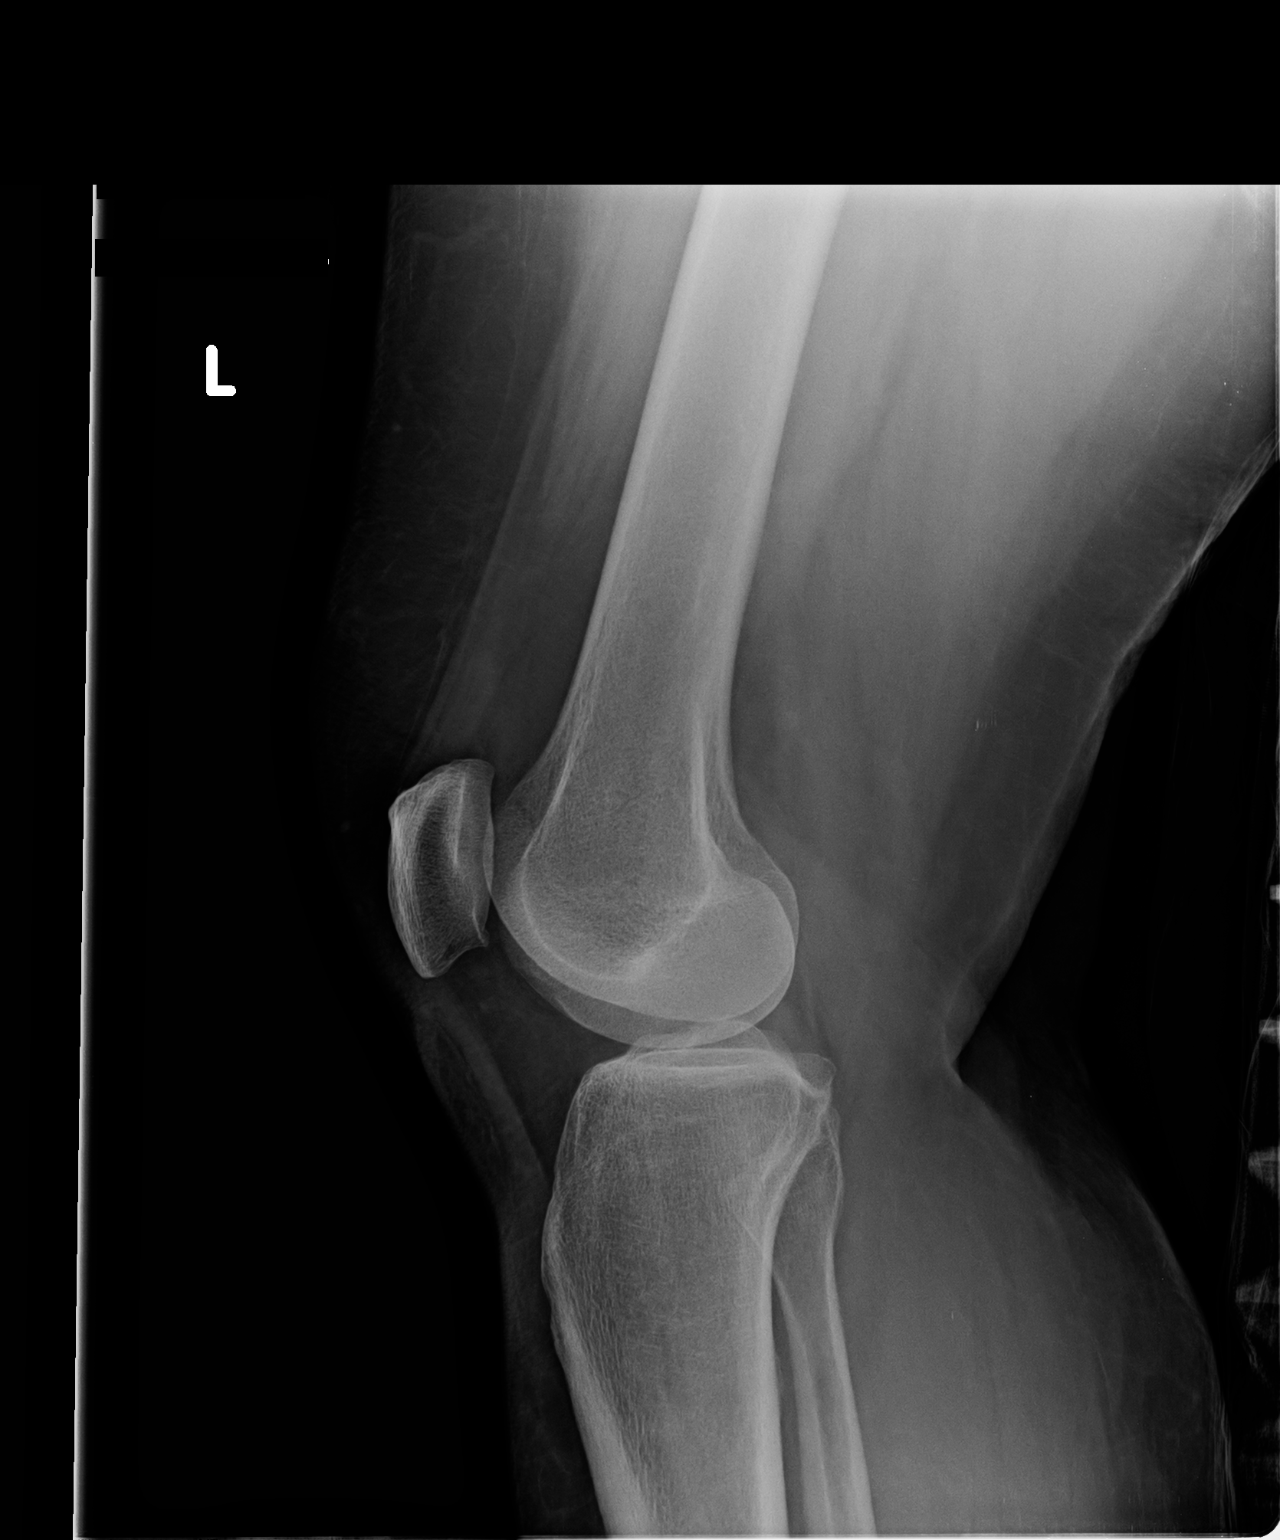

[2 of 2 positions shown; findings below may reference images not displayed]

DIAGNOSTIC STUDIES

EXAM

XR knee LT 3V

INDICATION

s/p fall, left knee pain
PT STATES SHE FELL. C/O PAIN TO THE LT KNEE. AK/BM

TECHNIQUE

AP lateral and tunnel views left knee were obtained.

COMPARISONS

None available

FINDINGS

No fractures or dislocations are seen. Joint spaces are well maintained.

IMPRESSION

No fractures or dislocations of the left knee.

Tech Notes:

PT STATES SHE FELL. C/O PAIN TO THE LT KNEE. AK/BM

## 2021-05-02 IMAGING — MR Head^Brain
10 series · 46 of 48 positions shown · non-contrast
Comparison: none

[Series 2: T1 · sagittal · 5.0mm · 0.45mm/px · 3 of 20 slices shown (1 of 2)]
[im 1/20]
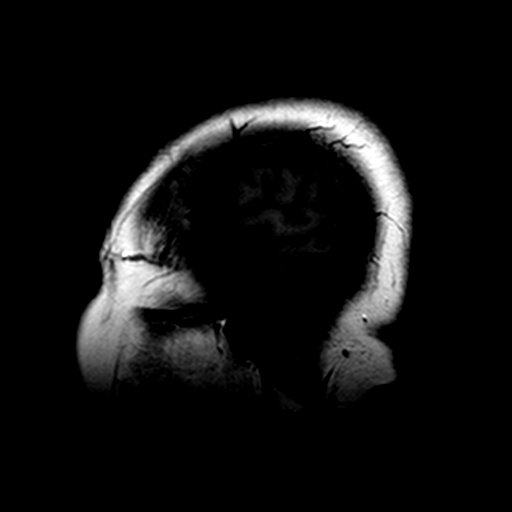
[im 10/20]
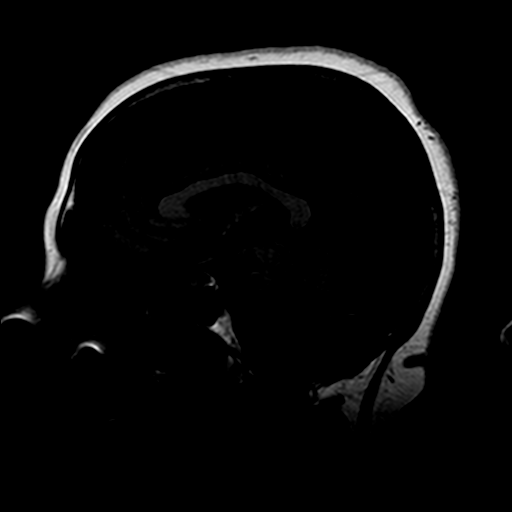
[im 20/20]
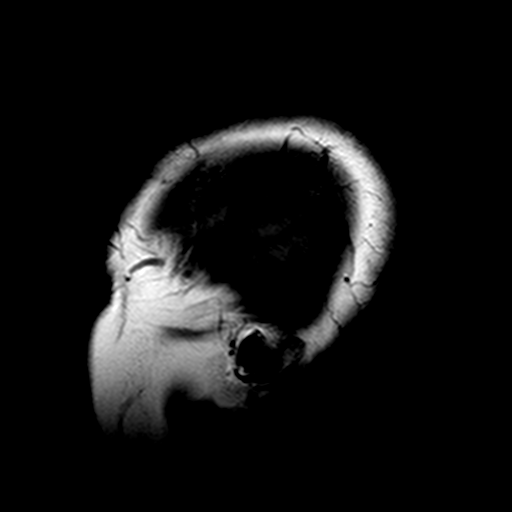

[Series 3: DWI · axial · 5.0mm · 1.80mm/px · z∈[-107,+21]mm · 11 of 62 slices shown (1 of 2)]
[im 1/62]
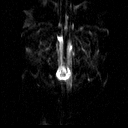
[im 7/62]
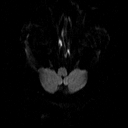
[im 13/62]
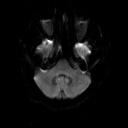
[im 19/62]
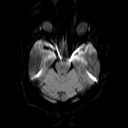
[im 25/62]
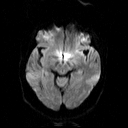
[im 31/62]
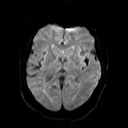
[im 37/62]
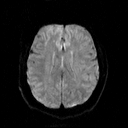
[im 43/62]
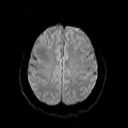
[im 49/62]
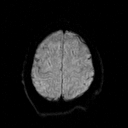
[im 55/62]
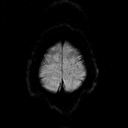
[im 62/62]
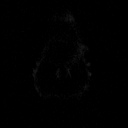

[Series 4: DWI · axial · 5.0mm · 1.80mm/px · z∈[-107,+21]mm · 4 of 21 slices shown (2 of 2)]
[im 1/21]
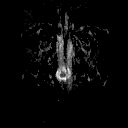
[im 7/21]
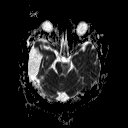
[im 14/21]
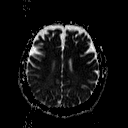
[im 21/21]
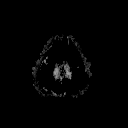

[Series 5: FLAIR · axial · 5.0mm · 0.45mm/px · z∈[-130,+19]mm · 4 of 24 slices shown]
[im 1/24]
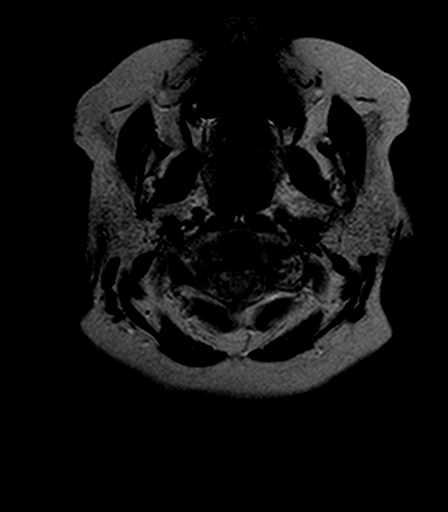
[im 8/24]
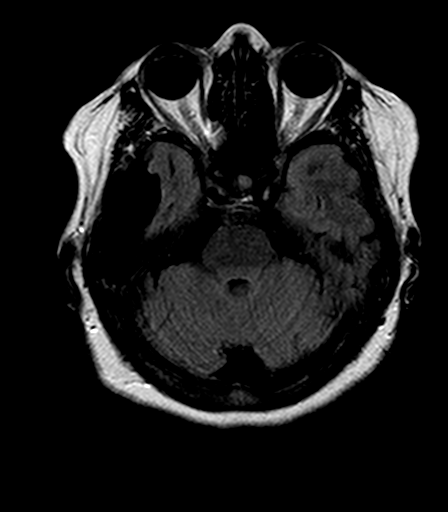
[im 16/24]
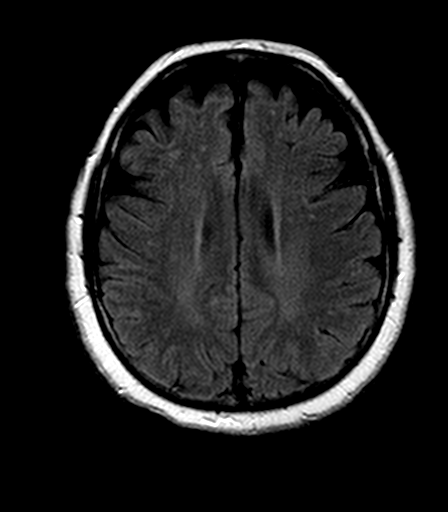
[im 24/24]
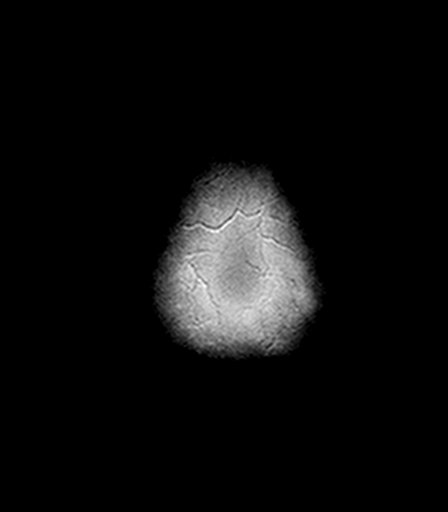

[Series 6: T2 · axial · 5.0mm · 0.72mm/px · z∈[-130,+19]mm · 4 of 24 slices shown (1 of 2)]
[im 1/24]
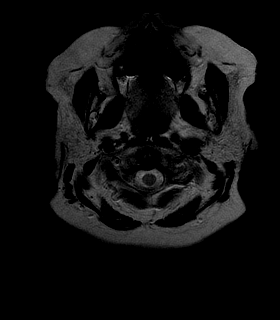
[im 8/24]
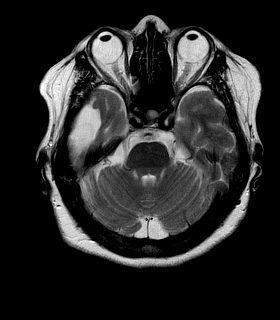
[im 16/24]
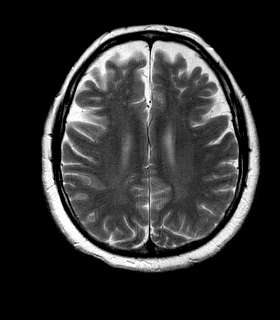
[im 24/24]
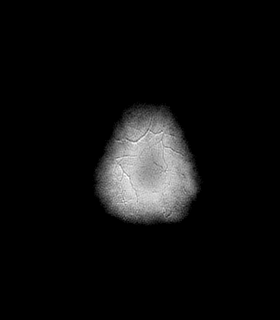

[Series 7: T1 · axial · 5.0mm · 0.45mm/px · z∈[-130,+19]mm · 4 of 24 slices shown (2 of 2)]
[im 1/24]
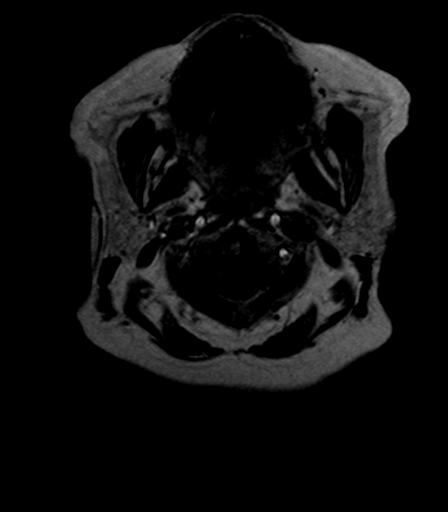
[im 8/24]
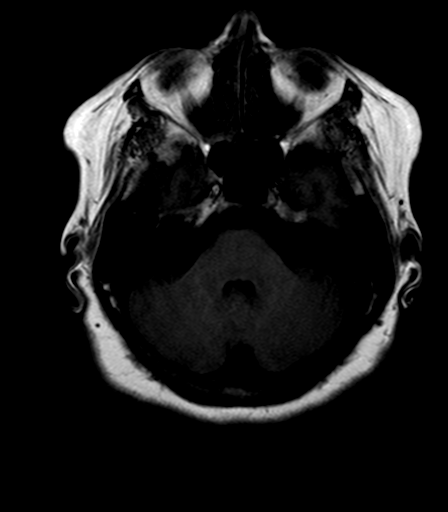
[im 16/24]
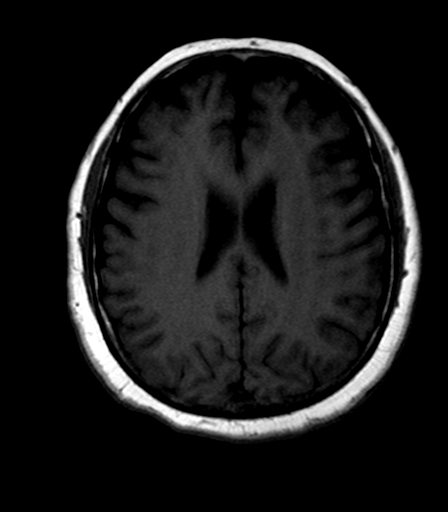
[im 24/24]
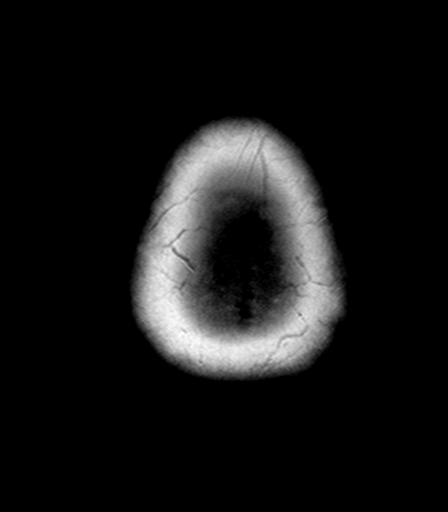

[Series 8: axial blood · axial · 5.0mm · 0.45mm/px · z∈[-120,-81]mm · 2 of 21 slices shown]
[im 1/21]
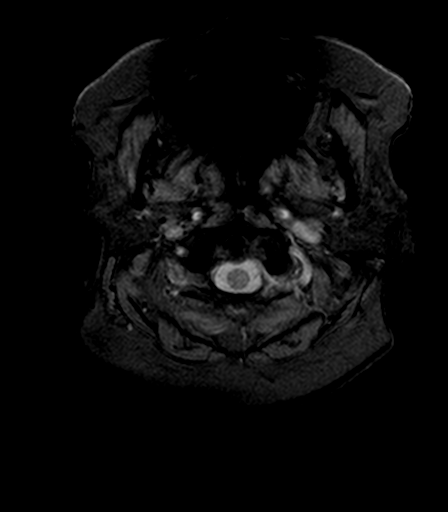
[im 7/21]
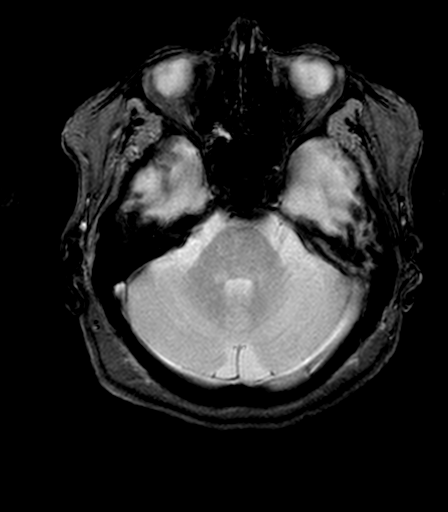

[Series 9: T2 · coronal · 5.0mm · 0.69mm/px · 5 of 26 slices shown (2 of 2)]
[im 1/26]
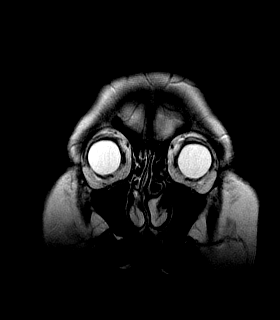
[im 7/26]
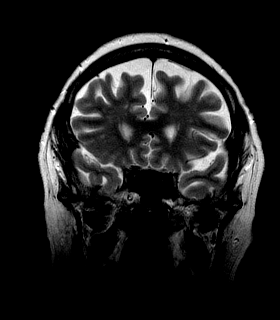
[im 13/26]
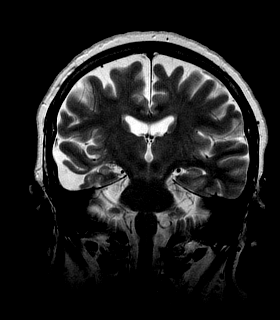
[im 19/26]
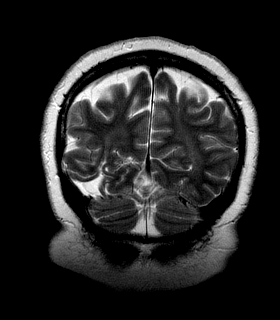
[im 26/26]
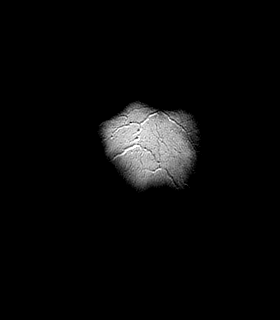

[Series 10: T1 fat-sat post-contrast · axial · 5.0mm · 0.90mm/px · z∈[-130,+19]mm · 4 of 24 slices shown (1 of 2)]
[im 1/24]
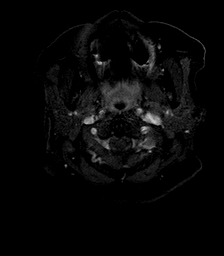
[im 8/24]
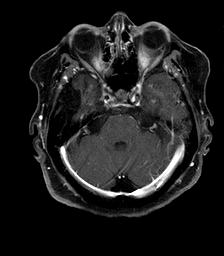
[im 16/24]
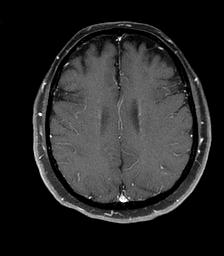
[im 24/24]
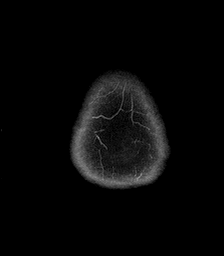

[Series 12: T1 fat-sat post-contrast · coronal · 5.0mm · 0.90mm/px · 5 of 26 slices shown (2 of 2)]
[im 1/26]
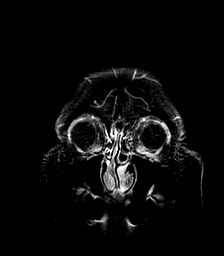
[im 7/26]
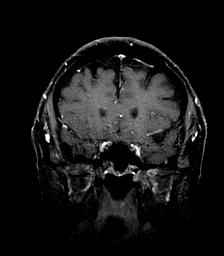
[im 13/26]
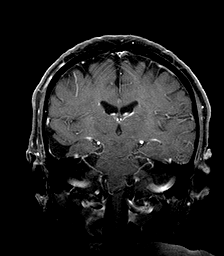
[im 19/26]
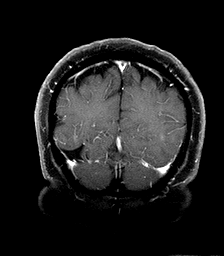
[im 26/26]
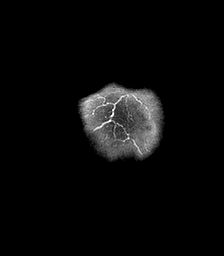

[46 of 48 positions shown; findings below may reference images not displayed]

DIAGNOSTIC STUDIES

EXAM

MRI of the brain with without contrast.

INDICATION

arachnoid cyst
POSSIBLE CYST ON CT.  INCREASE IN HEADACHES PER PT, DIZZINESS, SOME CONFUSION.  15 ML GADAVIST.  RG

TECHNIQUE

Sagittal, axial, and coronal images were obtained with variable T1 and T2 weighting.

COMPARISONS

None available

FINDINGS

There are no abnormal signal properties of the brainstem or visualized cervical cord. The expected
signal voids are noted throughout the major intracranial vascular structures, paranasal sinuses, and
mastoid air cells.

Scattered foci of T2 signal are seen throughout the supratentorial white matter consistent with
small vessel ischemic change. No acute ischemia is seen on diffusion-weighted imaging. Well-
marginated CSF density is noted within the right temporal fossa measuring 4.5 x 2 cm image 9 series
6. This demonstrates no enhancement consistent with arachnoid cyst.

No intracranial hemorrhage or abnormal enhancement is seen throughout the brain.

IMPRESSION

4.5 x 2 cm arachnoid cyst in the floor of the right middle cranial fossa.

Small vessel ischemic disease and cortical atrophy.

No acute ischemia or abnormal enhancement is noted throughout parenchyma.

Tech Notes:

POSSIBLE CYST ON CT.  INCREASE IN HEADACHES PER PT, DIZZINESS, SOME CONFUSION.  15 ML GADAVIST.  RG

## 2021-05-13 IMAGING — CR LOW_EXM
3 series · 3 of 3 positions shown · non-contrast
Comparison: none

[ankle ap]
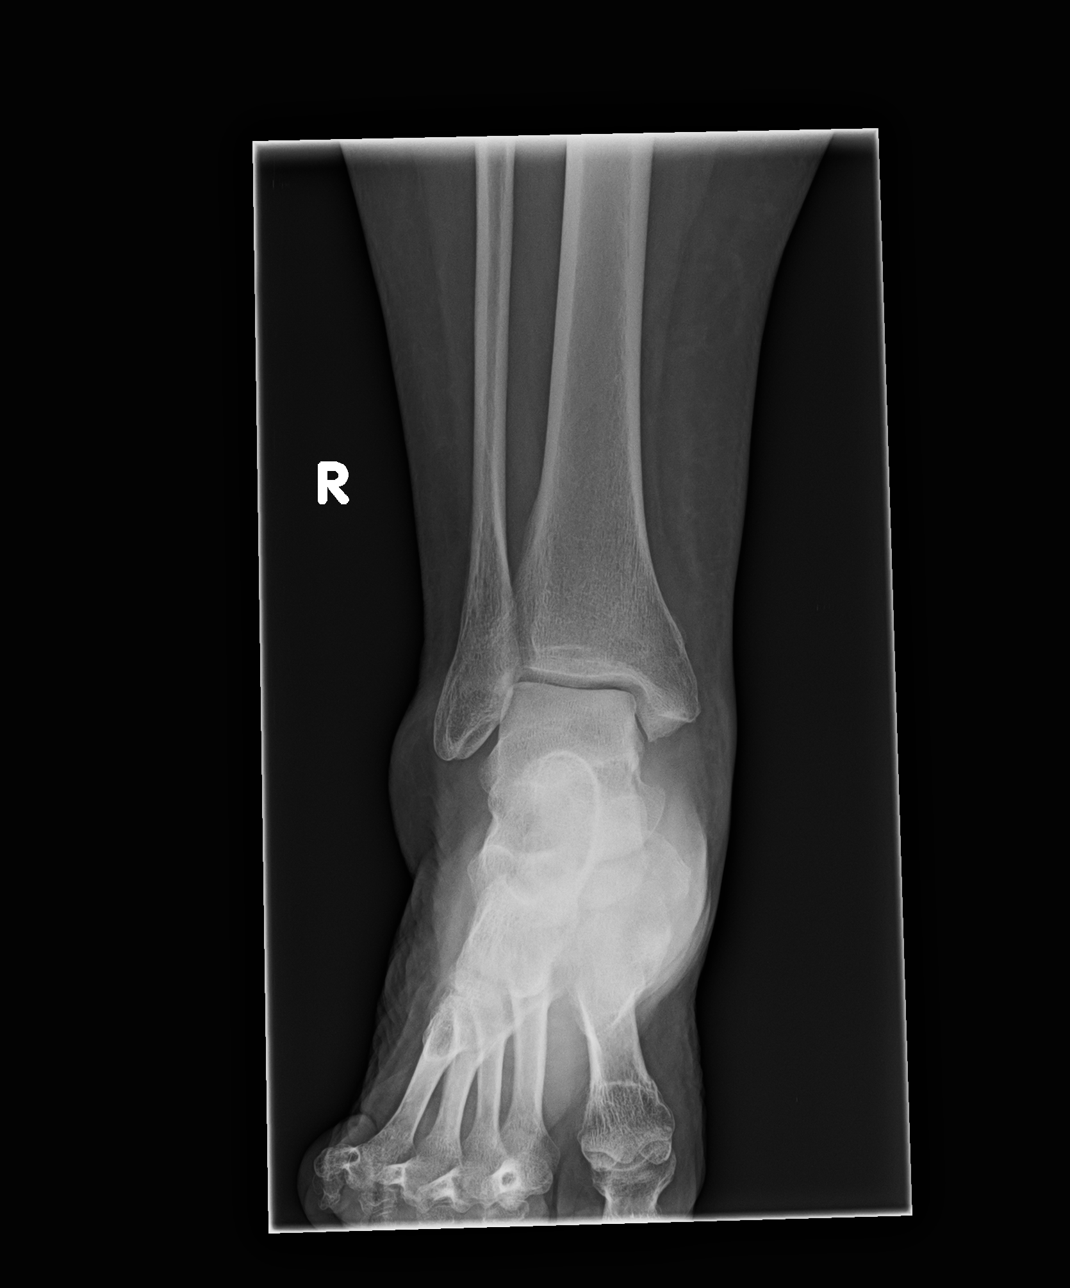

[ankle obl]
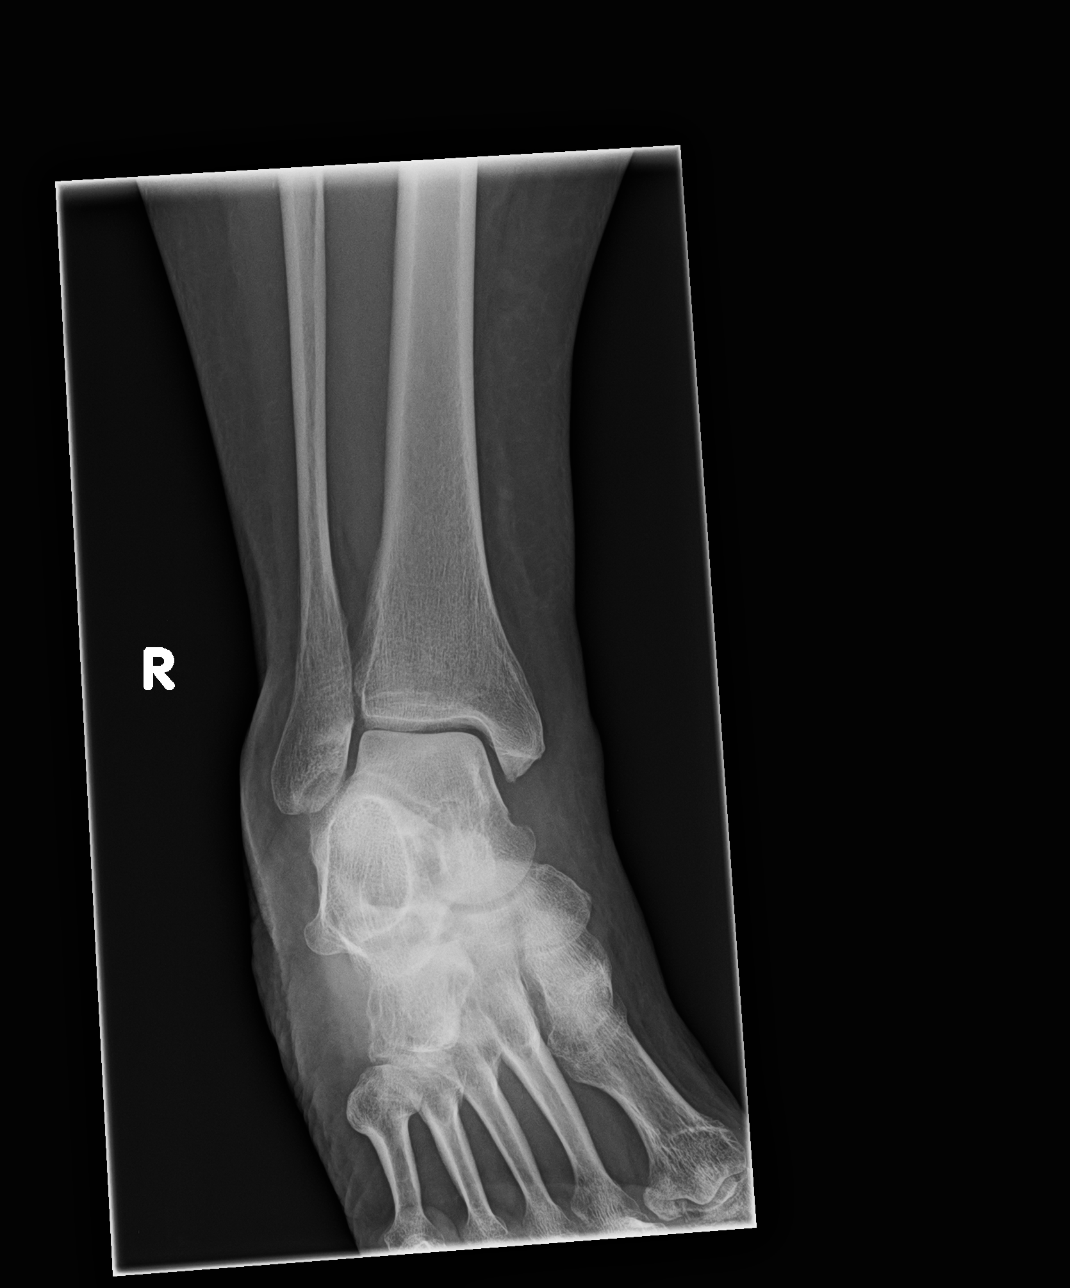

[ankle lat]
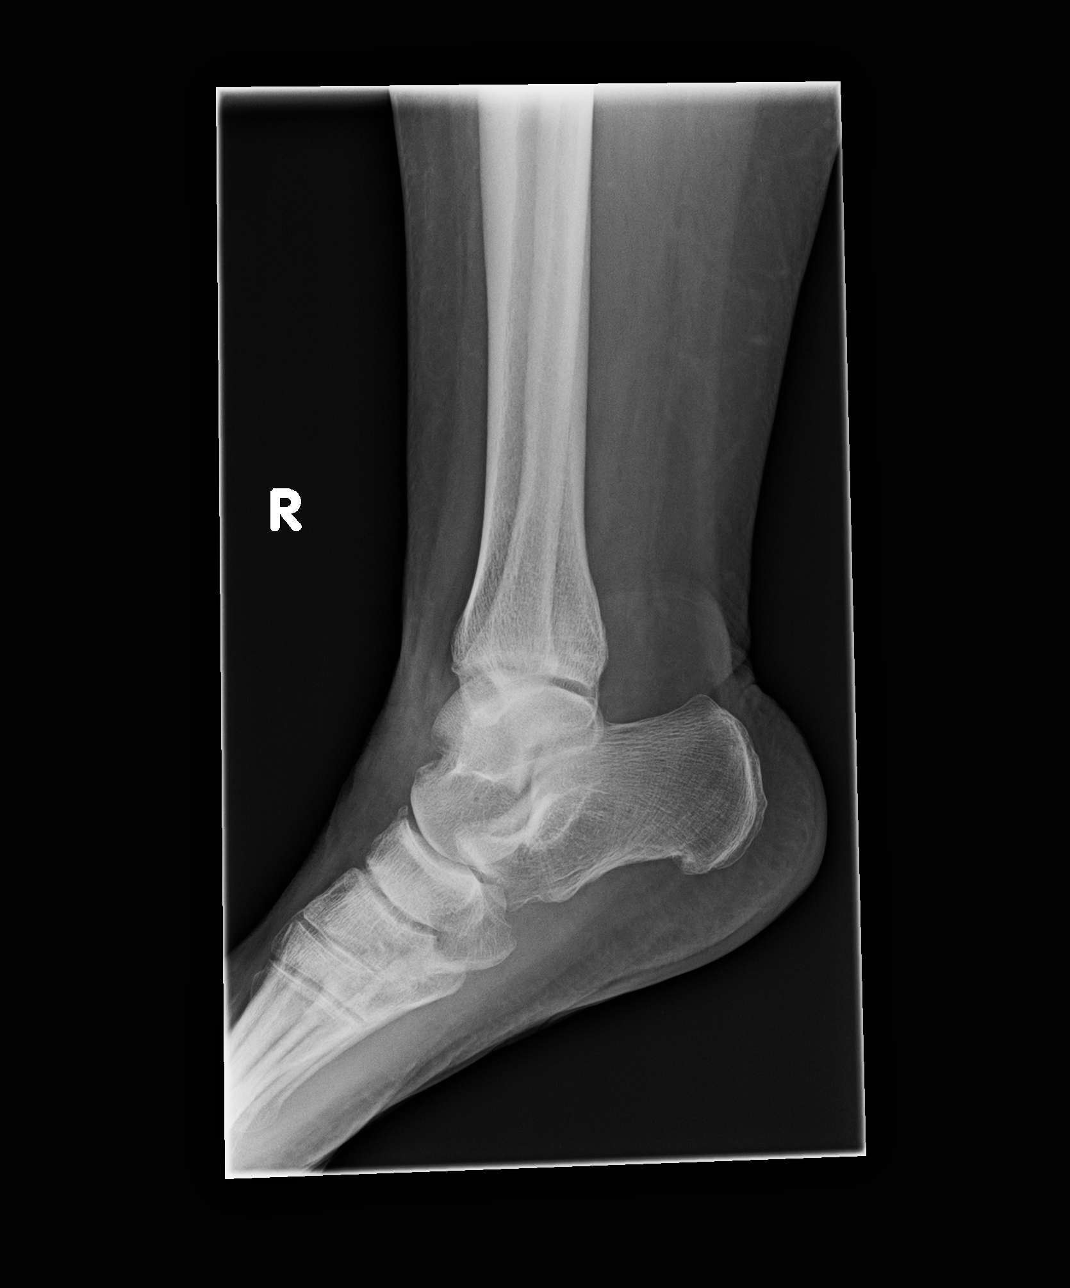

[3 of 3 positions shown; findings below may reference images not displayed]

DIAGNOSTIC STUDIES

EXAM

XR ankle RT min 3V

INDICATION

fracture follow-up
f/u ankle fx. me

TECHNIQUE

AP lateral and oblique views of the right ankle.

COMPARISONS

June 14, 2019

FINDINGS

Small avulsion fracture off the lateral talus is not well seen on today's exam. No new fractures
are seen. Joint spaces are well maintained.

IMPRESSION

Poor visualization of previously described ankle fracture likely indicating interval healing. No
new fractures are seen.

Tech Notes:

f/u ankle fx. me

## 2022-10-06 IMAGING — CR AAS
4 series · 4 of 4 positions shown · non-contrast
Comparison: none

[abdomen ap chest]
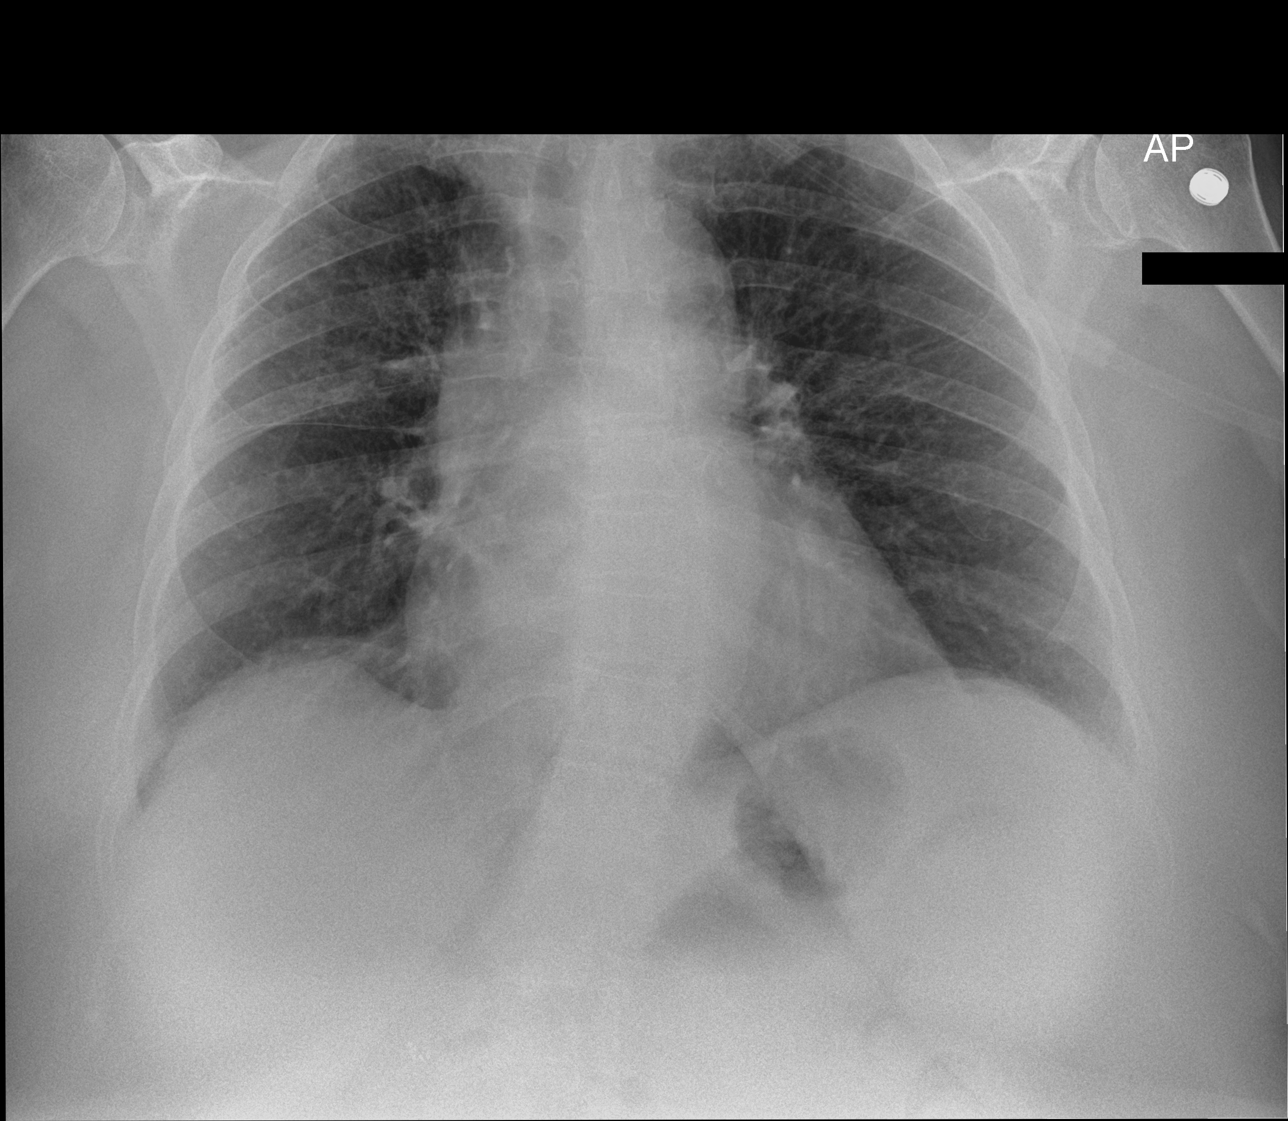

[abdomen ap upright]
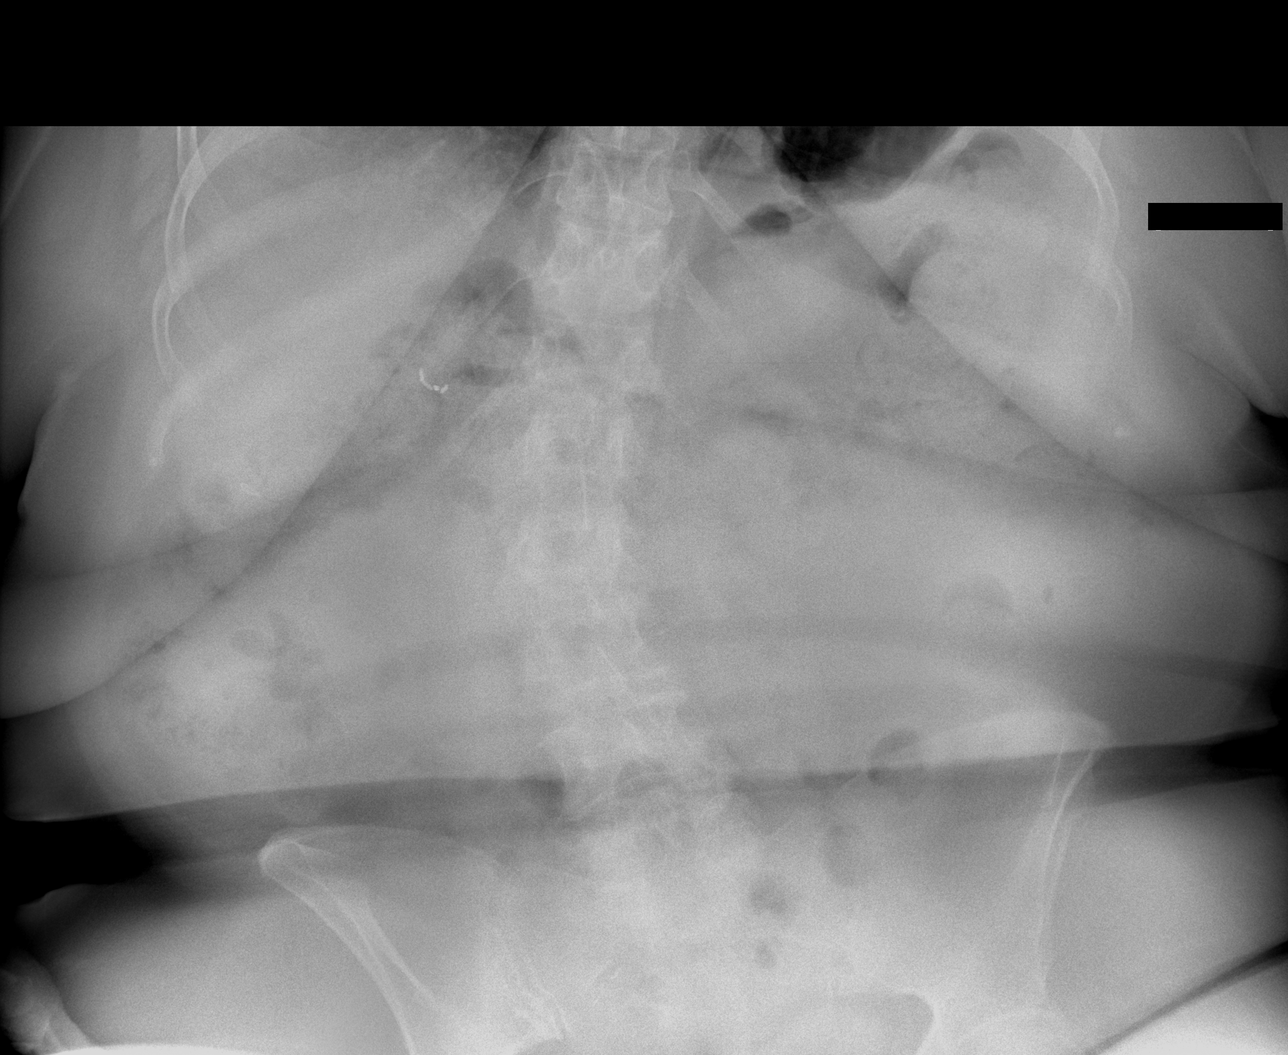

[abdomen kub supine (1 of 2)]
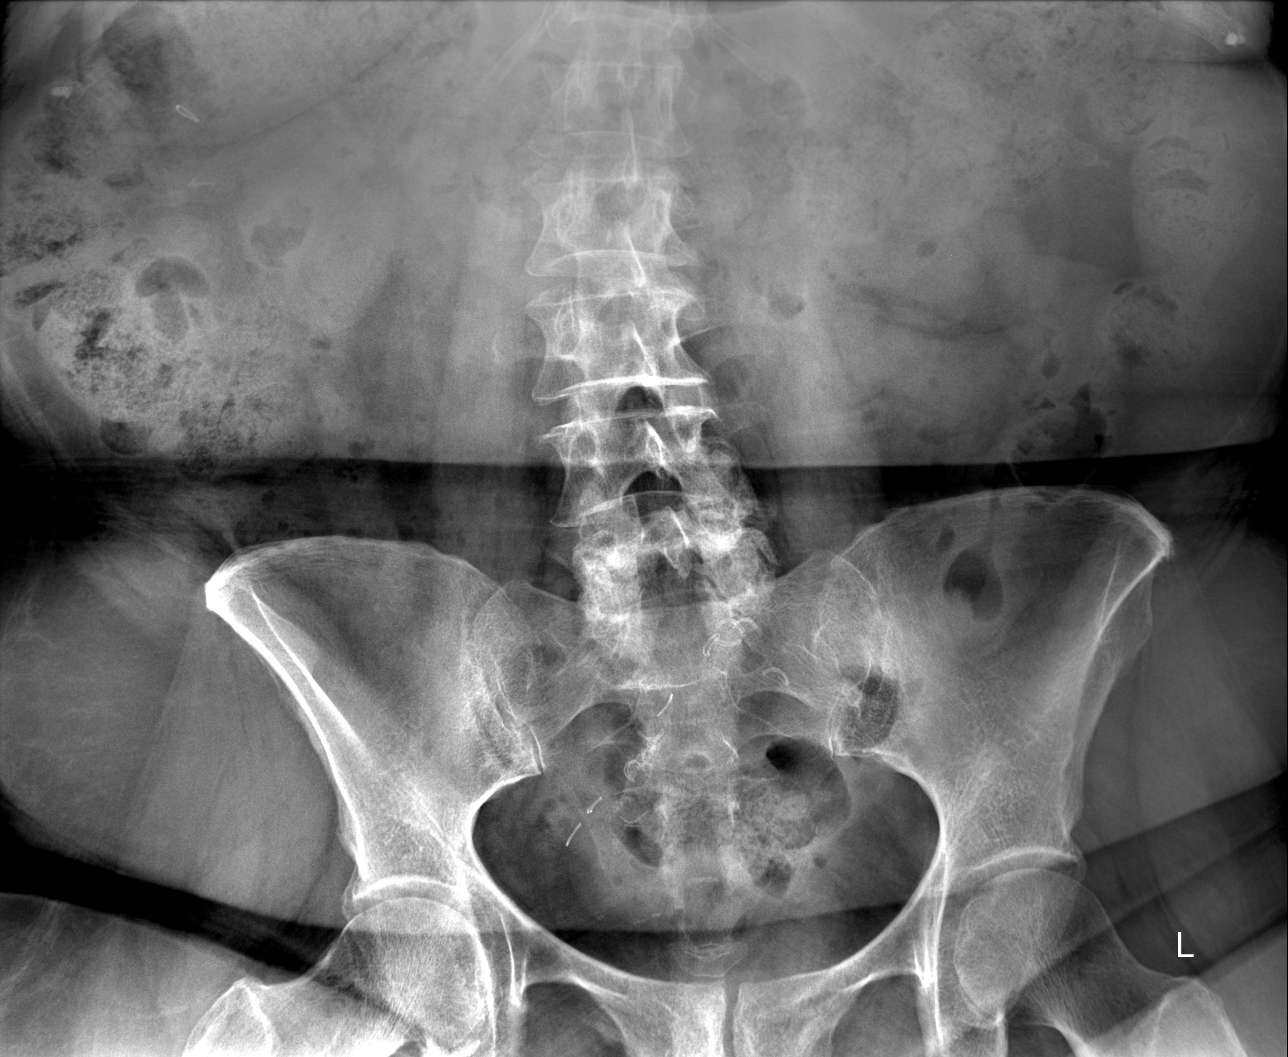

[abdomen kub supine (2 of 2)]
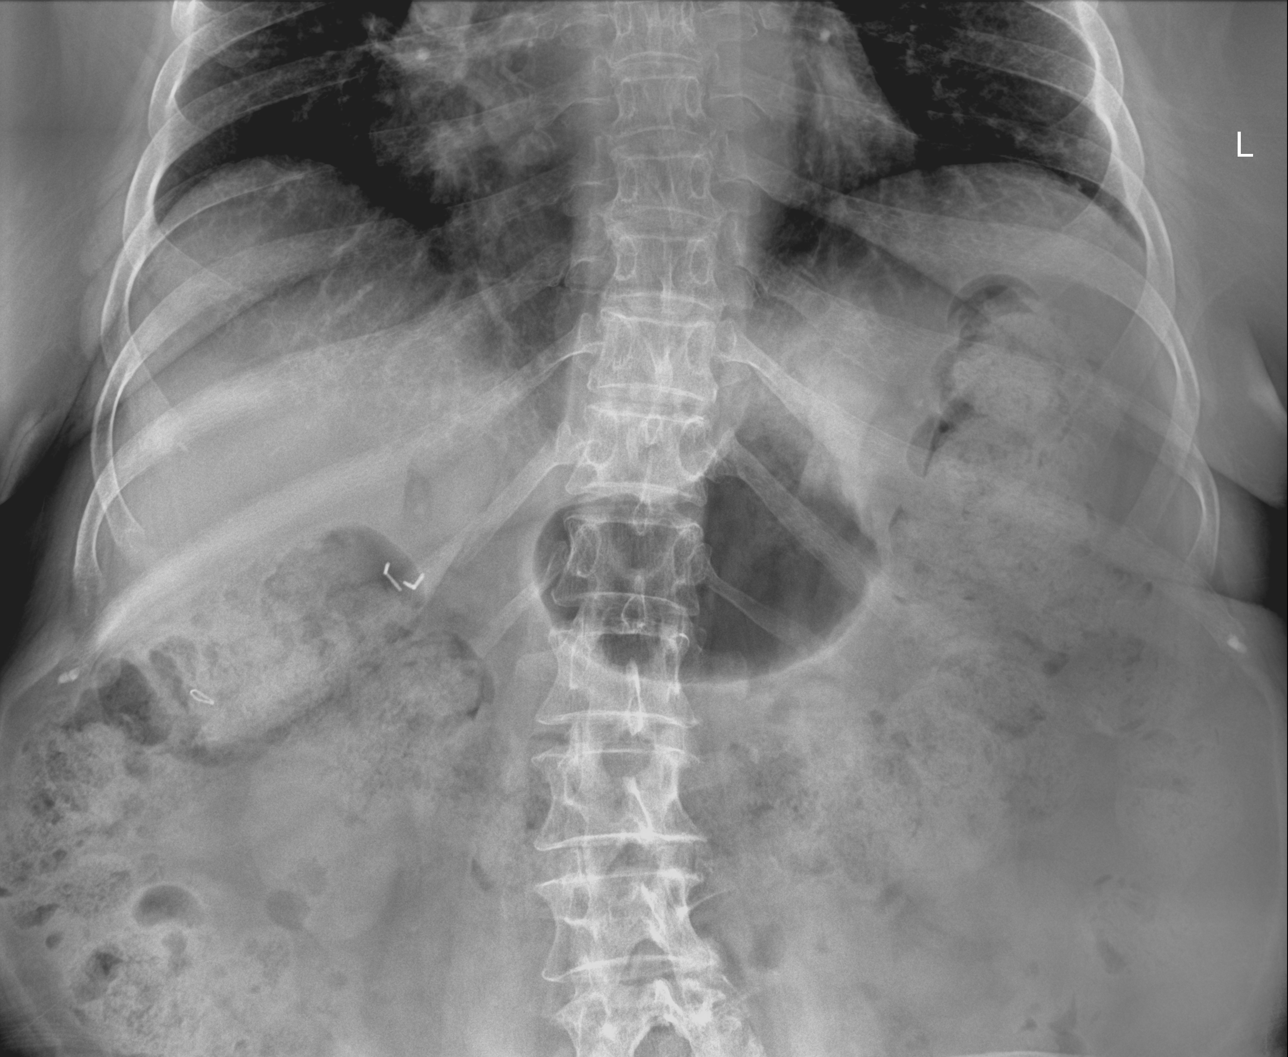

[4 of 4 positions shown; findings below may reference images not displayed]

EXAM

XR acute abdomen series

INDICATION

Abdominal pain

TECHNIQUE

AP view of the chest with AP upright view of the abdomen with supine views of the abdomen

COMPARISONS

None available at the time of dictation.

FINDINGS

Severe stool burden throughout the colon. Cholecystectomy clips in the right upper quadrant.
Surgical clips in the pelvis.

IMPRESSION
1. Severe constipation.

Tech Notes:

Abdominal pain. Patient denies having a good bowel movement in months. Colonoscopy 11/30/20.

## 2022-12-03 IMAGING — CR [ID]
3 series · 3 of 3 positions shown · non-contrast
Comparison: none

[knee ap]
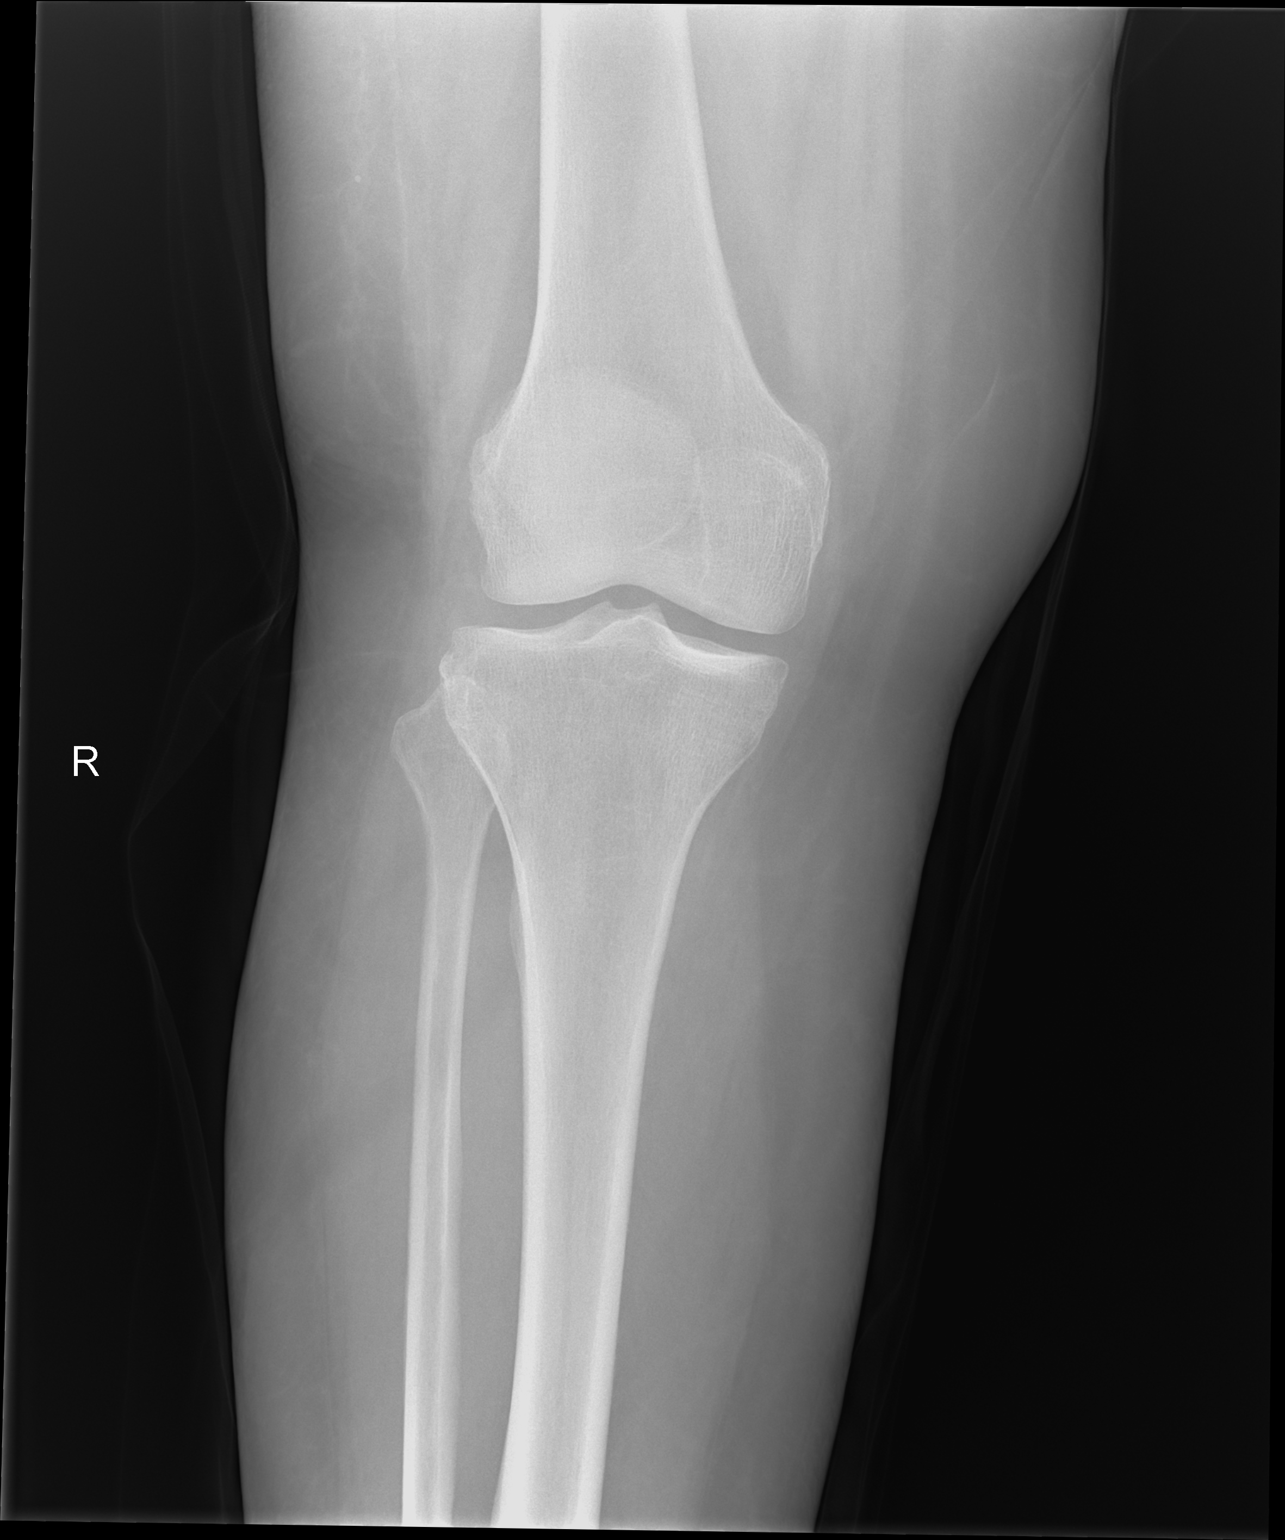

[knee obl]
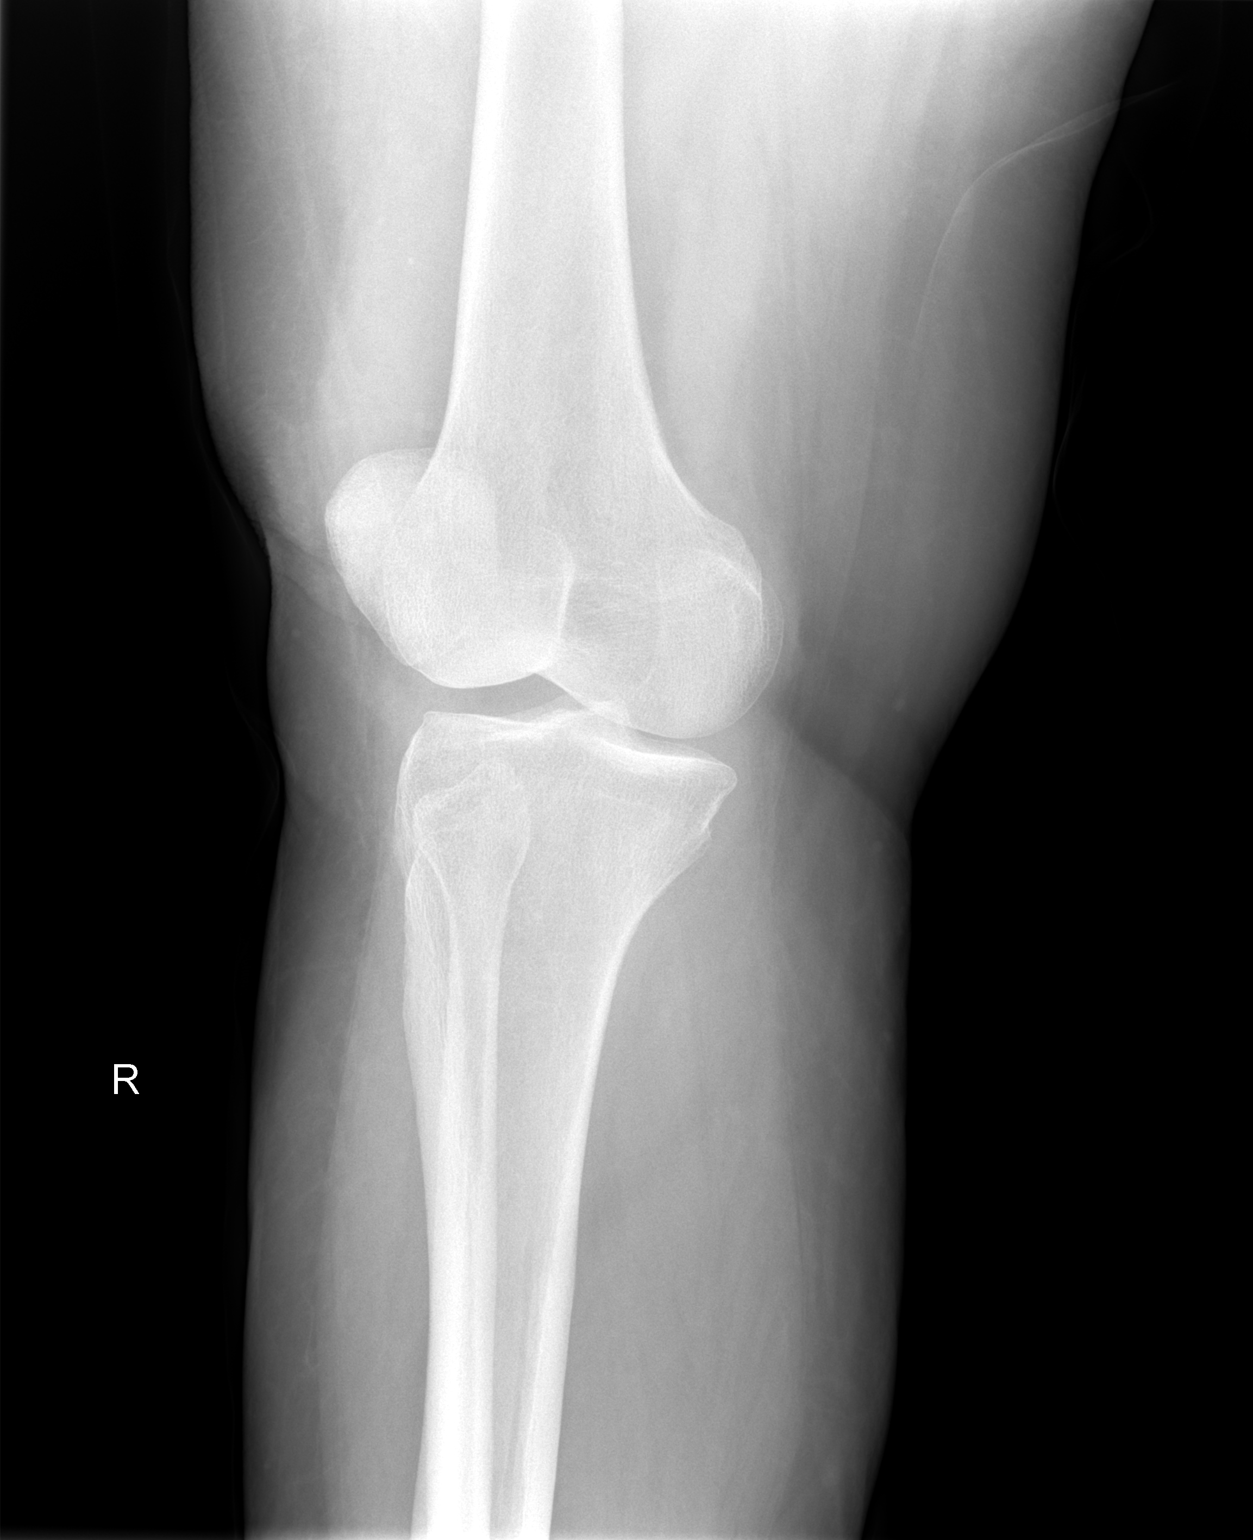

[knee lat]
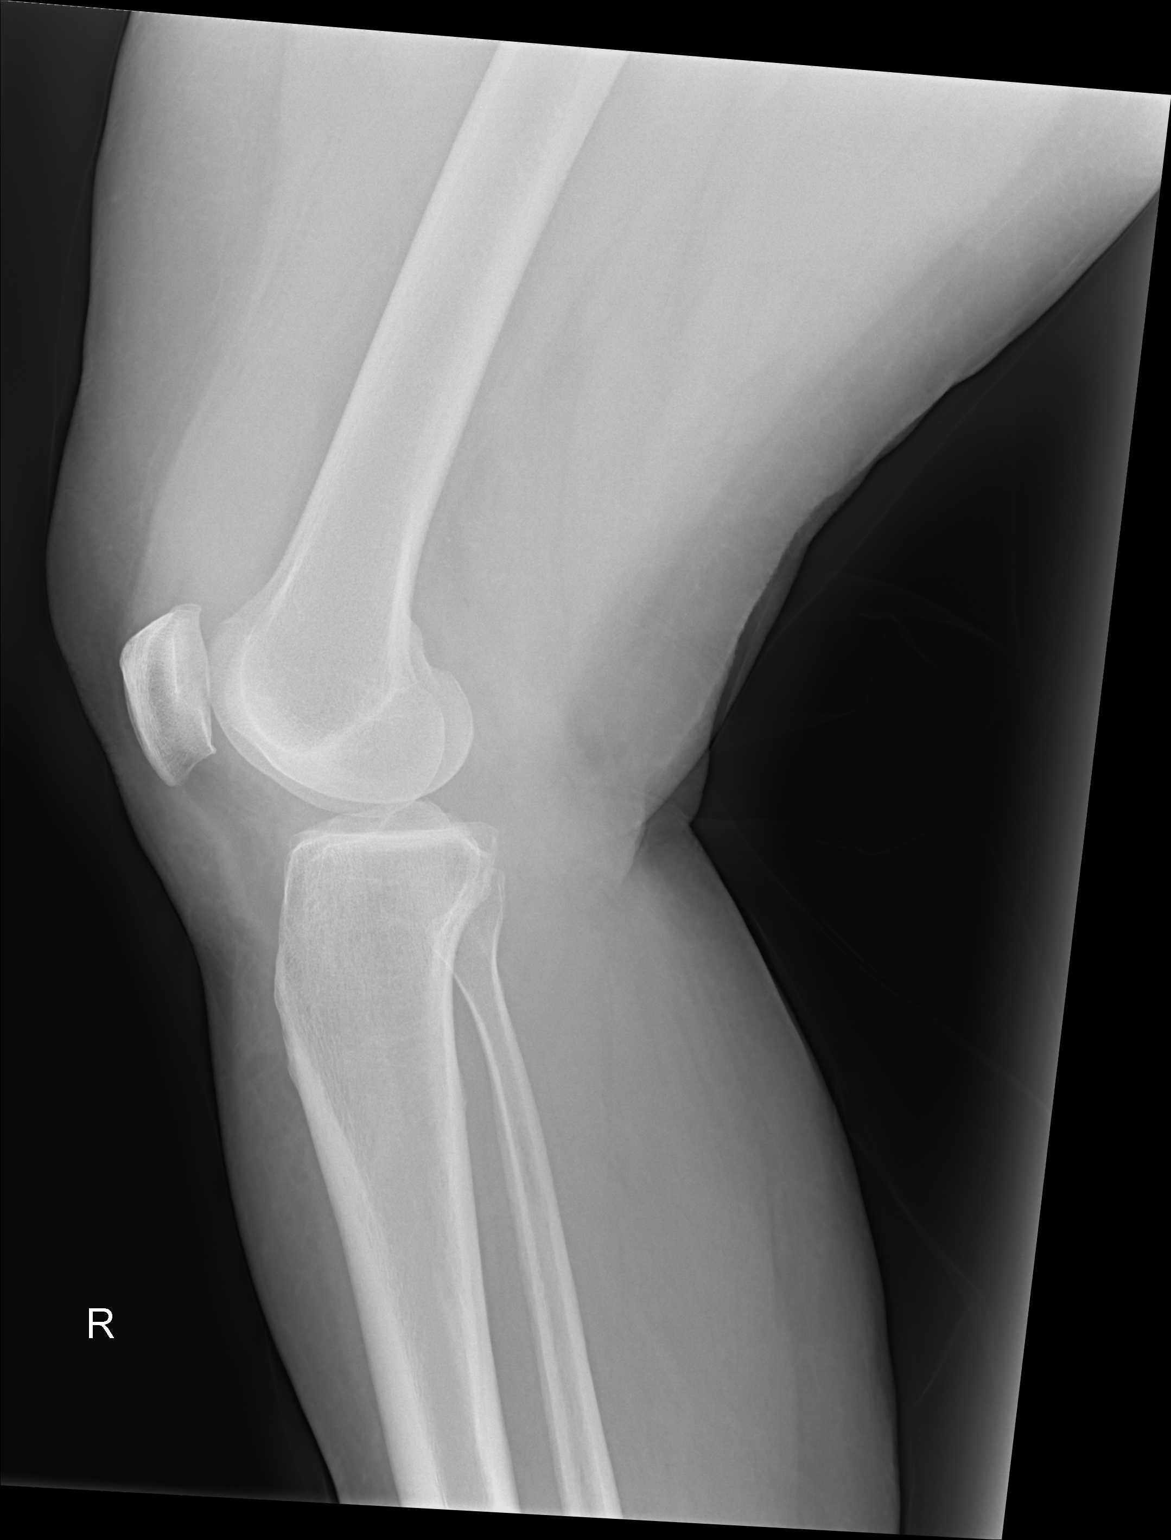

[3 of 3 positions shown; findings below may reference images not displayed]

EXAM

XR knee RT 3V

INDICATION

Right knee pain

TECHNIQUE

Three views of the right knee

COMPARISONS

None available at the time of dictation.

FINDINGS

No radiographic evidence of an acute fracture, osseous malalignment, or aggressive focal osseous
lesion. There is anatomic joint alignment. Medium-sized knee joint effusion.

IMPRESSION
1. Medium-sized knee joint effusion.

Tech Notes:

C/O KNEE PAIN AND STIFFNESS W/O INJURY.
HB
